# Patient Record
Sex: Female | Born: 1995
Health system: Southern US, Community
[De-identification: ages and names within clinical notes are randomized; demographics above are authoritative.]

## PROBLEM LIST (undated history)

## (undated) DIAGNOSIS — F419 Anxiety disorder, unspecified: Secondary | ICD-10-CM

## (undated) DIAGNOSIS — Z789 Other specified health status: Secondary | ICD-10-CM

## (undated) DIAGNOSIS — F32A Depression, unspecified: Secondary | ICD-10-CM

## (undated) HISTORY — DX: Depression, unspecified: F32.A

## (undated) HISTORY — DX: Other specified health status: Z78.9

## (undated) HISTORY — DX: Anxiety disorder, unspecified: F41.9

---

## 2016-06-07 DIAGNOSIS — Z3483 Encounter for supervision of other normal pregnancy, third trimester: Secondary | ICD-10-CM | POA: Diagnosis not present

## 2016-06-27 DIAGNOSIS — Z3483 Encounter for supervision of other normal pregnancy, third trimester: Secondary | ICD-10-CM | POA: Diagnosis not present

## 2016-06-27 DIAGNOSIS — Z3A38 38 weeks gestation of pregnancy: Secondary | ICD-10-CM | POA: Diagnosis not present

## 2016-06-28 DIAGNOSIS — Z3A38 38 weeks gestation of pregnancy: Secondary | ICD-10-CM | POA: Diagnosis not present

## 2016-08-30 DIAGNOSIS — Z3040 Encounter for surveillance of contraceptives, unspecified: Secondary | ICD-10-CM | POA: Diagnosis not present

## 2016-08-30 DIAGNOSIS — Z3202 Encounter for pregnancy test, result negative: Secondary | ICD-10-CM | POA: Diagnosis not present

## 2017-03-02 DIAGNOSIS — Z6832 Body mass index (BMI) 32.0-32.9, adult: Secondary | ICD-10-CM | POA: Diagnosis not present

## 2017-03-02 DIAGNOSIS — Z01419 Encounter for gynecological examination (general) (routine) without abnormal findings: Secondary | ICD-10-CM | POA: Diagnosis not present

## 2017-03-02 DIAGNOSIS — Z3202 Encounter for pregnancy test, result negative: Secondary | ICD-10-CM | POA: Diagnosis not present

## 2017-05-08 DIAGNOSIS — D225 Melanocytic nevi of trunk: Secondary | ICD-10-CM | POA: Diagnosis not present

## 2017-08-17 DIAGNOSIS — Z3201 Encounter for pregnancy test, result positive: Secondary | ICD-10-CM | POA: Diagnosis not present

## 2017-09-03 DIAGNOSIS — Z3481 Encounter for supervision of other normal pregnancy, first trimester: Secondary | ICD-10-CM | POA: Diagnosis not present

## 2017-09-03 DIAGNOSIS — Z3A00 Weeks of gestation of pregnancy not specified: Secondary | ICD-10-CM | POA: Diagnosis not present

## 2017-09-03 DIAGNOSIS — O3680X0 Pregnancy with inconclusive fetal viability, not applicable or unspecified: Secondary | ICD-10-CM | POA: Diagnosis not present

## 2017-09-06 DIAGNOSIS — Z3481 Encounter for supervision of other normal pregnancy, first trimester: Secondary | ICD-10-CM | POA: Diagnosis not present

## 2017-09-28 DIAGNOSIS — O3680X0 Pregnancy with inconclusive fetal viability, not applicable or unspecified: Secondary | ICD-10-CM | POA: Diagnosis not present

## 2017-09-28 DIAGNOSIS — Z3A10 10 weeks gestation of pregnancy: Secondary | ICD-10-CM | POA: Diagnosis not present

## 2017-10-10 DIAGNOSIS — Z3A12 12 weeks gestation of pregnancy: Secondary | ICD-10-CM | POA: Diagnosis not present

## 2017-10-10 DIAGNOSIS — Z23 Encounter for immunization: Secondary | ICD-10-CM | POA: Diagnosis not present

## 2017-10-10 DIAGNOSIS — Z3481 Encounter for supervision of other normal pregnancy, first trimester: Secondary | ICD-10-CM | POA: Diagnosis not present

## 2017-10-10 DIAGNOSIS — Z3682 Encounter for antenatal screening for nuchal translucency: Secondary | ICD-10-CM | POA: Diagnosis not present

## 2017-11-27 DIAGNOSIS — Z363 Encounter for antenatal screening for malformations: Secondary | ICD-10-CM | POA: Diagnosis not present

## 2017-11-27 DIAGNOSIS — Z3A19 19 weeks gestation of pregnancy: Secondary | ICD-10-CM | POA: Diagnosis not present

## 2017-12-31 DIAGNOSIS — R109 Unspecified abdominal pain: Secondary | ICD-10-CM | POA: Diagnosis not present

## 2018-01-23 DIAGNOSIS — Z3482 Encounter for supervision of other normal pregnancy, second trimester: Secondary | ICD-10-CM | POA: Diagnosis not present

## 2018-01-23 DIAGNOSIS — O4402 Placenta previa specified as without hemorrhage, second trimester: Secondary | ICD-10-CM | POA: Diagnosis not present

## 2018-01-29 DIAGNOSIS — R319 Hematuria, unspecified: Secondary | ICD-10-CM | POA: Diagnosis not present

## 2018-01-29 DIAGNOSIS — N390 Urinary tract infection, site not specified: Secondary | ICD-10-CM | POA: Diagnosis not present

## 2018-01-29 DIAGNOSIS — O2313 Infections of bladder in pregnancy, third trimester: Secondary | ICD-10-CM | POA: Diagnosis not present

## 2018-01-29 DIAGNOSIS — Z3A28 28 weeks gestation of pregnancy: Secondary | ICD-10-CM | POA: Diagnosis not present

## 2018-01-29 DIAGNOSIS — O2303 Infections of kidney in pregnancy, third trimester: Secondary | ICD-10-CM | POA: Diagnosis not present

## 2018-01-30 DIAGNOSIS — N3289 Other specified disorders of bladder: Secondary | ICD-10-CM | POA: Diagnosis not present

## 2018-01-30 DIAGNOSIS — N390 Urinary tract infection, site not specified: Secondary | ICD-10-CM | POA: Diagnosis not present

## 2018-01-30 DIAGNOSIS — O2303 Infections of kidney in pregnancy, third trimester: Secondary | ICD-10-CM | POA: Diagnosis not present

## 2018-01-30 DIAGNOSIS — Z3A28 28 weeks gestation of pregnancy: Secondary | ICD-10-CM | POA: Diagnosis not present

## 2018-02-06 DIAGNOSIS — Z23 Encounter for immunization: Secondary | ICD-10-CM | POA: Diagnosis not present

## 2018-02-06 DIAGNOSIS — Z3483 Encounter for supervision of other normal pregnancy, third trimester: Secondary | ICD-10-CM | POA: Diagnosis not present

## 2018-03-19 DIAGNOSIS — Z3483 Encounter for supervision of other normal pregnancy, third trimester: Secondary | ICD-10-CM | POA: Diagnosis not present

## 2018-04-11 DIAGNOSIS — Z3A38 38 weeks gestation of pregnancy: Secondary | ICD-10-CM | POA: Diagnosis not present

## 2018-04-12 DIAGNOSIS — Z79899 Other long term (current) drug therapy: Secondary | ICD-10-CM | POA: Diagnosis not present

## 2018-04-12 DIAGNOSIS — Z3A38 38 weeks gestation of pregnancy: Secondary | ICD-10-CM | POA: Diagnosis not present

## 2018-04-12 DIAGNOSIS — O99613 Diseases of the digestive system complicating pregnancy, third trimester: Secondary | ICD-10-CM | POA: Diagnosis not present

## 2018-04-12 DIAGNOSIS — O9962 Diseases of the digestive system complicating childbirth: Secondary | ICD-10-CM | POA: Diagnosis not present

## 2018-04-12 DIAGNOSIS — K219 Gastro-esophageal reflux disease without esophagitis: Secondary | ICD-10-CM | POA: Diagnosis not present

## 2018-04-13 DIAGNOSIS — Z3A38 38 weeks gestation of pregnancy: Secondary | ICD-10-CM | POA: Diagnosis not present

## 2018-05-02 DIAGNOSIS — E669 Obesity, unspecified: Secondary | ICD-10-CM | POA: Diagnosis not present

## 2018-05-02 DIAGNOSIS — F4325 Adjustment disorder with mixed disturbance of emotions and conduct: Secondary | ICD-10-CM | POA: Diagnosis not present

## 2018-05-16 ENCOUNTER — Ambulatory Visit (INDEPENDENT_AMBULATORY_CARE_PROVIDER_SITE_OTHER): Payer: BLUE CROSS/BLUE SHIELD | Admitting: Licensed Clinical Social Worker

## 2018-05-16 ENCOUNTER — Ambulatory Visit (INDEPENDENT_AMBULATORY_CARE_PROVIDER_SITE_OTHER): Payer: BLUE CROSS/BLUE SHIELD | Admitting: Pediatrics

## 2018-05-16 ENCOUNTER — Encounter: Payer: Self-pay | Admitting: Pediatrics

## 2018-05-16 VITALS — BP 105/60 | HR 85 | Ht 65.67 in | Wt 207.5 lb

## 2018-05-16 DIAGNOSIS — Z113 Encounter for screening for infections with a predominantly sexual mode of transmission: Secondary | ICD-10-CM | POA: Diagnosis not present

## 2018-05-16 DIAGNOSIS — F4323 Adjustment disorder with mixed anxiety and depressed mood: Secondary | ICD-10-CM | POA: Insufficient documentation

## 2018-05-16 DIAGNOSIS — Z638 Other specified problems related to primary support group: Secondary | ICD-10-CM | POA: Diagnosis not present

## 2018-05-16 MED ORDER — SERTRALINE HCL 25 MG PO TABS: 25.0000 mg | ORAL_TABLET | Freq: Every day | ORAL | 1 refills | Status: DC

## 2018-05-16 NOTE — BH Specialist Note (Signed)
Integrated Behavioral Health Initial Visit  MRN: [id number] Name: Kelly Powell  Number of Integrated Behavioral Health Clinician visits:: 1/6 Session Start time: 9:50 AM Session End time: 10:22 AM  Total time: 32 minutes  Type of Service: Integrated Behavioral Health- Individual/Family Interpretor:No. Interpretor Name and Language: N/A   Warm Hand Off Completed.      SUBJECTIVE: Kelly Powell is a 22 y.o. female accompanied by Patient Patient was referred by Kelly Ramus, NP for New Patient, anxiety, needs assessment. Patient reports the following symptoms/concerns: Overall anxious and overwhelmed, feels like snapping most of the time, feels unsupported. Discord with husband around communication and distribution of tasks. Duration of problem: Ongoing, more acute in the past few months- first started as a teen; Severity of problem: severe  OBJECTIVE: Mood: Euthymic and Affect: Appropriate Risk of harm to self or others: No plan to harm self or others  Social History:  Lifestyle habits that can impact QOL: Sleep:Terrible, 4 months of horrible sleep (third trimester and now breast feeding) - Try to go to bed around 9AM, Household gets up between 5A-7A Eating habits/patterns: 3 meals, sometimes breakfast is just a honey bun. Enjoys going out to eat. Water intake: About a gallon of water a day- thirsty all the time, but is breastfeeding. Screen time: Not a lot of time, too busy- TV on at night Exercise: Going to start at the gym soon, currently just normal daily movement around the house.  Goal: turning off TV at night   Confidentiality was discussed with the patient and if applicable, with caregiver as well. History or current traumatic events (natural disaster, house fire, etc.)? no History or current physical trauma?  no History or current emotional trauma?  yes, parents discord growing up History or current sexual trauma?  no History or current domestic or intimate  partner violence?  no History of bullying:  no  Suicidal or homicidal thoughts?   no Self injurious behaviors?  no Guns in the home?  yes, safe, locked up   GOALS ADDRESSED: Patient will: 1. Reduce symptoms of: anxiety and stress 2. Increase knowledge and/or ability of: coping skills, healthy habits, self-management skills and stress reduction  3. Demonstrate ability to: Increase healthy adjustment to current life circumstances and Increase adequate support systems for patient/family  INTERVENTIONS: Interventions utilized: Motivational Interviewing, Mindfulness or Management consultant, Mining engineer, Supportive Counseling and Psychoeducation and/or Health Education  Standardized Assessments completed: Not Needed  ASSESSMENT: Patient currently experiencing multiple stressors (3 kids 3,2,newborn), history of anger (likely stress and anxiety), feeling overwhelmed and ready to snap. Seeking medication management to "take the edge off and help me cope better."   Patient may benefit from brief interventions, psychotherapy. Patient is open to working on healthy communication and other skills.  PLAN: 1. Follow up with behavioral health clinician on : 05/30/2018 2. Behavioral recommendations: Patient to turn off TV at night, find task for husband to do with kids that she doesn't have to police and can praise for. Start Zoloft , return to see [facility] in 2 weeks. 3. Referral(s): Integrated [facility] [facility] (In Clinic) 4. "From scale of 1-10, how likely are you to follow plan?": 10  Kelly Powell, [place]

## 2018-05-16 NOTE — Progress Notes (Signed)
THIS RECORD MAY CONTAIN CONFIDENTIAL INFORMATION THAT SHOULD NOT BE RELEASED WITHOUT REVIEW OF THE SERVICE PROVIDER.  Adolescent Medicine Consultation Initial Visit Kelly Powell  is a 22 y.o. female referred by No ref. provider found here today for evaluation of anxiety, parenting stress.      Review of records?  yes  Pertinent Labs? No  Growth Chart Viewed? yes   History was provided by the patient.  PCP Confirmed?  Does not have adult- managed by GYN  Chief Complaint  Patient presents with  . New Patient (Initial Visit)    HPI:    Willing to come back and work with [facility]. She is willing to learn some coping skills and learn how to relate to her husband to help strengthen their relationship and get more help with the kids.  Anxious, stressed and overwhelmed with 3 young children  Angry with husband about some of his behaviors  Reports she was "crazy" as a teen- punch walls, get angry. Goal is not to scream at kids anymore. She threw water on her 5 year old the other day in a restaurant and then she really felt like she needed to get help. She has not had any thoughts of actively harming her children or herself.   Sleep has been bad through third trimester, up in the night now breastfeeding.  Her infant may have a milk protein allergy so she may end up stopping breastfeeding sooner depending on what happens in the next month.   Her husband is loving and supportive, however, is also fairly needy and has trouble figuring out how to help her so often stays outside tinkering with the pool ,cars, etc.    No LMP recorded.  Review of Systems:    Not on File Outpatient Medications Prior to Visit  Medication Sig Dispense Refill  . ibuprofen (ADVIL,MOTRIN) 800 MG tablet Take by mouth.    . butalbital-acetaminophen-caffeine (FIORICET, ESGIC) 50-325-40 MG tablet     . norethindrone (MICRONOR,CAMILA,ERRIN) 0.35 MG tablet      No facility-administered medications prior to visit.       Patient Active Problem List   Diagnosis Date Noted  . Parenting stress 05/17/2018  . Adjustment disorder with mixed anxiety and depressed mood 05/16/2018    Past Medical History:  Reviewed and updated?  yes Past Medical History:  Diagnosis Date  . Medical history non-contributory     Family History: Reviewed and updated? yes Family History  Problem Relation Age of Onset  . Anxiety disorder Mother   . Anxiety disorder Father   . Anxiety disorder Sister   . ADD / ADHD Brother   . Anxiety disorder Brother     Lifestyle habits that can impact QOL: Sleep:Terrible, 4 months of horrible sleep (third trimester and now breast feeding) - Try to go to bed around 9AM, Household gets up between 5A-7A Eating habits/patterns:3 meals, sometimes breakfast is just a honey bun. Enjoys going out to eat. Water intake:About a gallon of water a day- thirsty all the time, but is breastfeeding. Screen time:Not a lot of time, too busy- TV on at night Exercise:Going to start at the gym soon, currently just normal daily movement around the house.  Goal: turning off TV at night   Confidentiality was discussed with the patient and if applicable, with caregiver as well. History or current traumatic events (natural disaster, house fire, etc.)?no History or current physical trauma?no History or current emotional trauma?yes,parents discord growing up History or current sexual trauma?no History or  current domestic or intimate partner violence?no History of bullying:no  Suicidal or homicidal thoughts?no Self injurious behaviors?no Guns in the home?yes,safe, locked up    The following portions of the patient's history were reviewed and updated as appropriate: allergies, current medications, past family history, past medical history, past social history, past surgical history and problem list.  Physical Exam:  Vitals:   05/16/18 0937  BP: 105/60  Pulse: 85  Weight: 207  lb 8 oz (94.1 kg)  Height: 5' 5.67" (1.668 m)   BP 105/60 (BP Location: Left Arm, Patient Position: Sitting, Cuff Size: Normal)   Pulse 85   Ht 5' 5.67" (1.668 m)   Wt 207 lb 8 oz (94.1 kg)   BMI 33.83 kg/m  Body mass index: body mass index is 33.83 kg/m. Growth percentile SmartLinks can only be used for patients less than 59 years old.   Physical Exam  Constitutional: She appears well-developed. No distress.  HENT:  Mouth/Throat: Oropharynx is clear and moist.  Neck: No thyromegaly present.  Cardiovascular: Normal rate and regular rhythm.  No murmur heard. Pulmonary/Chest: Breath sounds normal.  Abdominal: Soft. She exhibits no mass. There is no tenderness. There is no guarding.  Musculoskeletal: She exhibits no edema.  Lymphadenopathy:    She has no cervical adenopathy.  Neurological: She is alert.  Skin: Skin is warm. No rash noted.  Psychiatric: She has a normal mood and affect.  Nursing note and vitals reviewed.    Assessment/Plan: Assessment/Plan: 1. Adjustment disorder with mixed anxiety and depressed mood Will start sertraline 25 mg daily. As studied, sertraline is the safest in breastfeeding, but most SSRIs known to be safe, just found in higher concentrations in breastmilk. zoloft if almost undetectable. We discussed this. She has no active SI or thoughts of hurting her children. She will f/u in 2 weeks with [facility] and we will likely increase her dose to 50 mg daily. Will do PHQSADs and edinburgh at next visit.  - sertraline (ZOLOFT) 25 MG tablet; Take 1 tablet (25 mg total) by mouth daily.  Dispense: 30 tablet; Refill: 1  2. Parenting stress Discussed ways to partner with husband in parenting and get more help and have more time together. She will have him give older girls a bath and get them to bed so she can have some down time from them.   3. Routine screening for STI (sexually transmitted infection) Per protocol.  - C. trachomatis/N. gonorrhoeae  RNA   Follow-up:   2 weeks with [facility], 4-6 weeks with provider   Medical decision-making:  >30 minutes spent face to face with patient with more than 50% of appointment spent discussing diagnosis, management, follow-up, and reviewing of anxiety, anger, parenting stress.  CC: No primary care provider on file., No ref. provider found

## 2018-05-16 NOTE — Patient Instructions (Signed)

## 2018-05-17 ENCOUNTER — Encounter: Payer: Self-pay | Admitting: Pediatrics

## 2018-05-17 DIAGNOSIS — Z638 Other specified problems related to primary support group: Secondary | ICD-10-CM | POA: Insufficient documentation

## 2018-05-17 LAB — C. TRACHOMATIS/N. GONORRHOEAE RNA
C. trachomatis RNA, TMA: NOT DETECTED
N. GONORRHOEAE RNA, TMA: NOT DETECTED

## 2018-05-30 ENCOUNTER — Other Ambulatory Visit: Payer: Self-pay | Admitting: Pediatrics

## 2018-05-30 ENCOUNTER — Ambulatory Visit (INDEPENDENT_AMBULATORY_CARE_PROVIDER_SITE_OTHER): Payer: BLUE CROSS/BLUE SHIELD | Admitting: Licensed Clinical Social Worker

## 2018-05-30 DIAGNOSIS — F4323 Adjustment disorder with mixed anxiety and depressed mood: Secondary | ICD-10-CM

## 2018-05-30 MED ORDER — SERTRALINE HCL 50 MG PO TABS: 50.0000 mg | ORAL_TABLET | Freq: Every day | ORAL | 2 refills | Status: DC

## 2018-05-30 NOTE — BH Specialist Note (Signed)
Integrated Behavioral Health Follow Up Visit  MRN: [id number] Name: Kelly Powell Heine  Number of Integrated Behavioral Health Clinician visits: 2/6 Session Start time: 9:00A  Session End time: 10:00AM Total time: 1 hour  Type of Service: Integrated Behavioral Health- Individual/Family Interpretor:No. Interpretor Name and Language: N/A  SUBJECTIVE: Kelly Powell Sawchuk is a 22 y.o. female accompanied by Patient Patient was referred by Alfonso Ramusaroline Hacker, NP for medication monitoring, anxiety, relationship challenges with spouse.. Patient reports the following symptoms/concerns: Communication challenges with husband, some improvement in mood Duration of problem: Years, but less acute in the past 2 weeks; Severity of problem: moderate  OBJECTIVE: Mood: Euthymic and Affect: Appropriate Risk of harm to self or others: No plan to harm self or others  LIFE CONTEXT: Family and Social: At home with spouse, 3 kids, dog School/Work: Works night shift Thurs, Fri, Sat -12hour shifts Self-Care: Minimal, likes to go out to eat Life Changes: Birth of 3rd baby  GOALS ADDRESSED: Patient will: 1.  Reduce symptoms of: stress  2.  Increase knowledge and/or ability of: coping skills, healthy habits and stress reduction  3.  Demonstrate ability to: Increase healthy adjustment to current life circumstances  INTERVENTIONS: Interventions utilized:  Motivational Interviewing, Solution-Focused Strategies, Behavioral Activation, Medication Monitoring and Psychoeducation and/or Health Education Standardized Assessments completed: Edinburgh Postnatal Depression  -Score = 3  Zoloft- 25mg  2:30PM, every day No missed doses  The Antidepressant Side Effect Checklist (ASEC)  Symptom Score (0-3) Linked to Medication? Comments  Dry Mouth 0    Drowsiness 0    Insomnia 0    Blurred Vision 0    Headache 0    Constipation 0    Diarrhea  0    Increased Appetite 0    Decreased Appetite 0    Nausea/Vomiting 0    Problems  Urinating 0    Problems with Sex 0    Palpitations 0    Lightheaded on Standing 0    Room Spinning 0    Sweating 0    Feeling Hot 0    Tremor 0    Disoriented 0    Yawning 0    Weight Gain 0    Other Symptoms? No  Treatment for Side Effects?   Side Effects make you want to stop taking??     ASSESSMENT: Patient currently experiencing improvement in mood, feeling less impacted by stressors. Says she tried everything discussed, husband did not help with bathtime. Concerns around kids' sleeping schedule.  Discussed: 5 Love Languages (patient and husband to take quiz and pick one thing they will do for each other) Healthy communication (I feel, prefacing challenging topics, "How can I help?".) Sleep/reward charts for kids.   Patient may benefit from consistency and partnership with spouse as a team.  PLAN: 1. Follow up with behavioral health clinician on : 06/20/18 2. Behavioral recommendations: See above 3. Referral(s): [facility] 4. "From scale of 1-10, how likely are you to follow plan?": 10  Gaetana MichaelisShannon W Kincaid, [place]

## 2018-06-20 ENCOUNTER — Ambulatory Visit (INDEPENDENT_AMBULATORY_CARE_PROVIDER_SITE_OTHER): Payer: BLUE CROSS/BLUE SHIELD | Admitting: Licensed Clinical Social Worker

## 2018-06-20 ENCOUNTER — Ambulatory Visit: Payer: BLUE CROSS/BLUE SHIELD | Admitting: Pediatrics

## 2018-06-20 ENCOUNTER — Encounter: Payer: Self-pay | Admitting: Pediatrics

## 2018-06-20 VITALS — BP 108/67 | HR 65 | Ht 65.0 in | Wt 198.2 lb

## 2018-06-20 DIAGNOSIS — F4323 Adjustment disorder with mixed anxiety and depressed mood: Secondary | ICD-10-CM

## 2018-06-20 DIAGNOSIS — Z638 Other specified problems related to primary support group: Secondary | ICD-10-CM | POA: Diagnosis not present

## 2018-06-20 NOTE — Progress Notes (Signed)
THIS RECORD MAY CONTAIN CONFIDENTIAL INFORMATION THAT SHOULD NOT BE RELEASED WITHOUT REVIEW OF THE SERVICE PROVIDER.  Adolescent Medicine Consultation Follow-Up Visit Kelly Powell  is a 22 y.o. female here today for follow-up regarding anxiety, parenting stress.    Last seen in Adolescent Medicine Clinic on 05/16/18 for anxiety.  Plan at last visit included starting sertraline 25 mg daily then increased to 50 mg at visit on 5/23.     History was provided by the patient.   PCP Confirmed?  Does not have adult- managed by GYN  My Chart Activated?   yes    Chief Complaint  Patient presents with  . Follow-up  . Medication Management    HPI:   Kelly Powell reports that she has been doing a lot better. She has noticed a significant difference since increasing to 49 with improved mood and less irritability. Not yelling at kids anymore. Not worried about small things like house getting cleaned. Did love languages quiz with husband and worked on healthy communication that has improved their relationship. Did rewards charts for children for sleeping that has worked well- 21 days in a row of good sleep. Leg achiness at night 3 x this past month, was wondering if its related to medication.      Has had continuous bleeding on progestrone OCP.  No Known Allergies Outpatient Medications Prior to Visit  Medication Sig Dispense Refill  . butalbital-acetaminophen-caffeine (FIORICET, ESGIC) 50-325-40 MG tablet     . ibuprofen (ADVIL,MOTRIN) 800 MG tablet Take by mouth.    . norethindrone (MICRONOR,CAMILA,ERRIN) 0.35 MG tablet     . sertraline (ZOLOFT) 50 MG tablet Take 1 tablet (50 mg total) by mouth daily. 30 tablet 2   No facility-administered medications prior to visit.      Patient Active Problem List   Diagnosis Date Noted  . Parenting stress 05/17/2018  . Adjustment disorder with mixed anxiety and depressed mood 05/16/2018    Social History: Lives with: husband, 3 children Exercise:  got  free membership at gym, interested in starting Sleep:  Sleeping through the night now that 54 month old is sleeping through the night  Eating: skipping meals, eating one big meal a day to try to lose weight  Confidentiality was discussed with the patient and if applicable, with caregiver as well.  Tobacco?  no Drugs/ETOH?  no Partner preference?  female Sexually Active?  yes  Pregnancy Prevention:  progesterone only birth control, husband getting vasectomy next week Trauma currently or in the pastt?  no Suicidal or Self-Harm thoughts?   no Guns in the home?  Yes, safe, locked up    The following portions of the patient's history were reviewed and updated as appropriate: allergies, current medications, past family history, past medical history, past social history, past surgical history and problem list.  Physical Exam:  Vitals:   06/20/18 0911  BP: 108/67  Pulse: 65  Weight: 198 lb 3.2 oz (89.9 kg)  Height: 5\' 5"  (1.651 m)   BP 108/67   Pulse 65   Ht 5\' 5"  (1.651 m)   Wt 198 lb 3.2 oz (89.9 kg)   BMI 32.98 kg/m  Body mass index: body mass index is 32.98 kg/m. Growth percentile SmartLinks can only be used for patients less than 3 years old.   Physical Exam  Gen: well appearing female, well nourished HEENT: EOMI, nares patent Pulm: lungs clear, comfortable WOB CV: RRR, no murmurs Abd: soft, NT Neuro: alert, appropriate  Psych: smiling, normal mood affect  PHQ-SADs with improved symptoms of anxiety, depression   Assessment/Plan: 1. Adjustment disorder with mixed anxiety and depressed mood PHQ-SADs with improved symptoms of anxiety/depression, reported improvement in irritability. She feels like she may have plateaued - return for visit with behavioral health on 7/11, possibly increase dose to 75 mg at that time  2. Parenting stress - discussed continued ways to partner with husband in parenting - strategies discussed with [facility] have been working well  Follow-up:  F/u  with behavioral health in 2 weeks then with provider in 4 weeks  Medical decision-making:  >25 minutes spent face to face with patient with more than 50% of appointment spent discussing diagnosis, management, follow-up, and reviewing of anxiety, depression, parenting stress.  Lelan Ponsaroline Newman, MD

## 2018-06-20 NOTE — BH Specialist Note (Signed)
Integrated Behavioral Health Follow Up Visit  MRN: [id number] Name: Kelly StandardHannah Bryden   Any copied material below has been reviewed for accuracy.  Number of Integrated Behavioral Health Clinician visits: 3/6 Session Start time: 9:42 AM   Session End time: 10:12 AM  Total time: 30 minutes  Type of Service: Integrated Behavioral Health- Individual/Family Interpretor:No. Interpretor Name and Language: N/A  SUBJECTIVE: Kelly Powell is a 22 y.o. female accompanied by Patient Patient was referred by Alfonso Ramusaroline Hacker, NP for medication monitoring, anxiety, relationship challenges with spouse.. Patient reports the following symptoms/concerns: Communication challenges with husband, some improvement in mood Duration of problem: years; Severity of problem: moderate  OBJECTIVE: Mood: Euthymic and Affect: Appropriate Risk of harm to self or others: No plan to harm self or others  LIFE CONTEXT: Family and Social: At home with spouse, 3 kids, dog School/Work: Works night shift Thurs, Fri, Sat -12hour shifts Self-Care: Minimal, likes to go out to eat Life Changes: Birth of 3rd baby  GOALS ADDRESSED: Patient will: 1.  Reduce symptoms of: stress  2.  Increase knowledge and/or ability of: coping skills, healthy habits and stress reduction  3.  Demonstrate ability to: Increase healthy adjustment to current life circumstances   INTERVENTIONS: Interventions utilized:  Motivational Interviewing, Solution-Focused Strategies, Behavioral Activation, Supportive Counseling and Psychoeducation and/or Health Education Standardized Assessments completed: Not Needed  ASSESSMENT: Patient currently experiencing continued improvement in mood, going to bring husband to next session. Below find list of goals that have been in process, but patient wanted for her refrigerator.    Healthy Communication . "How can I help?" . Assume the best intention. When someone says something, try to assume they are being  positive and kind. Ex: What are you doing? Think: They are worried about me because they love me. . Using "I language." Ex: I feel sad when D.R. Horton, IncBrooke screams all day. Instead of: YOU NEVER HELP ME WITH BROOKE AND SHE HAS BEEN SCREAMING ALL DAY!  Love Languages . Trying to think about how we can serve each other's needs based on Love Languages?  Kids . Planned ignoring - ignore bad behavior as long as children are safe. . Offer choices that please the parents. Ex: You can put your shoes on or you can go brush your teeth. .Both option accomplish things that you want, but the child feels they have more control. Reward charts/ sticker charts for behaviors.Marland Kitchen.  PLAN: 1. Follow up with behavioral health clinician on : 7/11 2. Behavioral recommendations: Patient to continue to work with husband on their goals, husband to come to next visit. 3. Referral(s): [facility] 4. "From scale of 1-10, how likely are you to follow plan?": 10  Gaetana MichaelisShannon W Kincaid, [place]

## 2018-06-20 NOTE — Patient Instructions (Signed)
Return to see Ruben GottronShannon Kincaid on July 11th.  Eat 6 small meals a day to get the best nutrition to feed your baby

## 2018-07-04 ENCOUNTER — Ambulatory Visit: Payer: Self-pay | Admitting: Licensed Clinical Social Worker

## 2018-07-04 ENCOUNTER — Other Ambulatory Visit: Payer: Self-pay | Admitting: Family

## 2018-07-04 ENCOUNTER — Telehealth (INDEPENDENT_AMBULATORY_CARE_PROVIDER_SITE_OTHER): Payer: Self-pay

## 2018-07-04 MED ORDER — SERTRALINE HCL 50 MG PO TABS: 50.0000 mg | ORAL_TABLET | Freq: Every day | ORAL | 2 refills | Status: DC

## 2018-07-04 NOTE — Telephone Encounter (Signed)
Rx sent 

## 2018-07-04 NOTE — Telephone Encounter (Signed)
Received RX refill request from [facility] for patient for Kelly RamusCaroline Hacker to refill Sertraline HCL 25mg . Provider no longer in office, will route to her provider, and her nurse so refill may be handled by correct office.

## 2018-07-11 ENCOUNTER — Ambulatory Visit: Payer: BLUE CROSS/BLUE SHIELD | Admitting: Licensed Clinical Social Worker

## 2018-08-19 DIAGNOSIS — O9122 Nonpurulent mastitis associated with the puerperium: Secondary | ICD-10-CM | POA: Diagnosis not present

## 2018-09-18 ENCOUNTER — Telehealth: Payer: Self-pay | Admitting: *Deleted

## 2018-09-18 ENCOUNTER — Other Ambulatory Visit: Payer: Self-pay | Admitting: Pediatrics

## 2018-09-18 MED ORDER — SERTRALINE HCL 50 MG PO TABS: 50.0000 mg | ORAL_TABLET | Freq: Every day | ORAL | 2 refills | Status: DC

## 2018-09-18 NOTE — Telephone Encounter (Signed)
Pt made f/u appointment on 10/3 but out of Zoloft and needs bridge refill. Routing to [facility].

## 2018-09-18 NOTE — Telephone Encounter (Signed)
Done

## 2018-09-26 ENCOUNTER — Encounter: Payer: Self-pay | Admitting: Pediatrics

## 2018-09-26 ENCOUNTER — Ambulatory Visit: Payer: BLUE CROSS/BLUE SHIELD | Admitting: Pediatrics

## 2018-09-26 ENCOUNTER — Other Ambulatory Visit: Payer: Self-pay

## 2018-09-26 VITALS — BP 97/65 | HR 83 | Ht 64.76 in | Wt 184.2 lb

## 2018-09-26 DIAGNOSIS — F4323 Adjustment disorder with mixed anxiety and depressed mood: Secondary | ICD-10-CM | POA: Diagnosis not present

## 2018-09-26 DIAGNOSIS — Z638 Other specified problems related to primary support group: Secondary | ICD-10-CM

## 2018-09-26 MED ORDER — SERTRALINE HCL 100 MG PO TABS: 100.0000 mg | ORAL_TABLET | Freq: Every day | ORAL | 2 refills | Status: DC

## 2018-09-26 NOTE — Progress Notes (Signed)
THIS RECORD MAY CONTAIN CONFIDENTIAL INFORMATION THAT SHOULD NOT BE RELEASED WITHOUT REVIEW OF THE SERVICE PROVIDER.  Adolescent Medicine Consultation Follow-Up Visit Aziza Stuckert  is a 22 y.o. female referred by No ref. provider found here today for follow-up regarding anxiety, parenting stress.    Last seen in [facility]olescent Medicine Clinic on 06/20/18 for anxiety, parenting stress.  Plan at last visit included increased Zoloft dose to 50mg , continue counseling with BH.  Pertinent Labs? No Growth Chart Viewed? not applicable   History was provided by the patient.  Interpreter? no PCP Confirmed?  No PCP My Chart Activated?   no   Chief Complaint  Patient presents with  . Follow-up    HPI:  Patient states she is noticing she gets frustrated more easily and quickly than before although still improved from initial visit. Has been working with [facility] with good effect and identifiable triggers. Recent new stressors include recently selling their house, living in a camper, and recently accepted a new job. Current coping mechanisms include talking to her mom and sister. Denies chest pain, SOB, nausea, vomiting. Recently started dieting to eat healthier.   No LMP recorded. Allergies  Allergen Reactions  . Bee Venom Hives   Outpatient Medications Prior to Visit  Medication Sig Dispense Refill  . ibuprofen (ADVIL,MOTRIN) 800 MG tablet Take by mouth.    . sertraline (ZOLOFT) 50 MG tablet Take 1 tablet (50 mg total) by mouth daily. 30 tablet 2  . butalbital-acetaminophen-caffeine (FIORICET, ESGIC) 50-325-40 MG tablet     . norethindrone (MICRONOR,CAMILA,ERRIN) 0.35 MG tablet      No facility-administered medications prior to visit.      Patient Active Problem List   Diagnosis Date Noted  . Parenting stress 05/17/2018  . Adjustment disorder with mixed anxiety and depressed mood 05/16/2018   Lifestyle habits that can impact QOL: Sleep:difficulty sleeping due to sleep cycle of young children,  works 3rd shift Eating habits/patterns: recently started dieting, low carb Exercise: no formal exercise  Suicidal or homicidal thoughts?   no Self injurious behaviors?  no  The following portions of the patient's history were reviewed and updated as appropriate: allergies, current medications, past medical history, past social history and problem list.  Physical Exam:  Vitals:   09/26/18 1341  BP: 97/65  Pulse: 83  Weight: 184 lb 3.2 oz (83.6 kg)  Height: 5' 4.76" (1.645 m)   BP 97/65   Pulse 83   Ht 5' 4.76" (1.645 m)   Wt 184 lb 3.2 oz (83.6 kg)   BMI 30.88 kg/m  Body mass index: body mass index is 30.88 kg/m. Growth percentile SmartLinks can only be used for patients less than 72 years old.  Physical Exam  Constitutional: She is oriented to person, place, and time. She appears well-developed and well-nourished. No distress.  Cardiovascular: Normal rate, regular rhythm and normal heart sounds.  No murmur heard. Pulmonary/Chest: Effort normal and breath sounds normal.  Abdominal: Soft. Bowel sounds are normal.  Neurological: She is alert and oriented to person, place, and time.  Skin: Skin is warm and dry.  Psychiatric: She has a normal mood and affect. Her behavior is normal. Judgment normal.   Assessment/Plan: Nazli is a healthy 22yo F here for anxiety and parenting stress.  Anxiety Symptoms better controlled than initially although with some increase in irritability and anxiousness. Will increase Zoloft to 100mg  daily and continue therapy with BH. Follow up in one month.  BH screenings: PHQ-SADs reviewed and indicated increased irritability and  anxiousness. Screens discussed with patient and parent and adjustments to plan made accordingly.   Follow-up:  No follow-ups on file.   Medical decision-making:  >15 minutes spent face to face with patient with more than 50% of appointment spent discussing diagnosis, management, follow-up, and reviewing of  anxiety.

## 2018-09-26 NOTE — Patient Instructions (Signed)
It was great to see you!  Our plans for today:  - We are increasing your Zoloft to 100mg  daily.  - Follow up in one month.  Take care and seek immediate care sooner if you develop any concerns.   Dr. [facility]

## 2018-10-21 ENCOUNTER — Telehealth: Payer: Self-pay | Admitting: *Deleted

## 2018-10-21 ENCOUNTER — Other Ambulatory Visit: Payer: Self-pay | Admitting: Pediatrics

## 2018-10-21 MED ORDER — NITROFURANTOIN MONOHYD MACRO 100 MG PO CAPS: 100.0000 mg | ORAL_CAPSULE | Freq: Two times a day (BID) | ORAL | 0 refills | Status: AC

## 2018-10-21 NOTE — Telephone Encounter (Signed)
Done

## 2018-10-21 NOTE — Progress Notes (Signed)
ma

## 2018-10-21 NOTE — Telephone Encounter (Signed)
Pt called reporting dysuria that started this morning. She is afebrile. No back pain or abdominal pain. No changes in discharge. Pt asking if antibiotic can be sent in. Advised if still symptomatic after medication- she should have an office visit to follow up. Pt voiced understanding.

## 2018-10-28 ENCOUNTER — Ambulatory Visit: Payer: Self-pay | Admitting: Pediatrics

## 2018-10-28 ENCOUNTER — Ambulatory Visit: Payer: Self-pay | Admitting: Licensed Clinical Social Worker

## 2018-12-05 ENCOUNTER — Telehealth: Payer: Self-pay | Admitting: *Deleted

## 2018-12-05 ENCOUNTER — Other Ambulatory Visit: Payer: Self-pay | Admitting: Pediatrics

## 2018-12-05 MED ORDER — ONDANSETRON HCL 4 MG PO TABS: 4.0000 mg | ORAL_TABLET | Freq: Three times a day (TID) | ORAL | 0 refills | Status: DC | PRN

## 2018-12-05 NOTE — Telephone Encounter (Signed)
Pt called office today asking for some medication to treat emesis. No fever noted. Emesis started today along with diarrhea and pt asking for Zofran to be sent to [facility]. Gave return precautions and pt agrees to follow up if condition worsens or persists.

## 2018-12-05 NOTE — Telephone Encounter (Signed)
Sent 4 mg tablets. Adults take 4-8 mg every 8 hours as needed. Children take 2 mg every 8 hours as needed.

## 2019-01-03 ENCOUNTER — Ambulatory Visit (INDEPENDENT_AMBULATORY_CARE_PROVIDER_SITE_OTHER): Payer: BLUE CROSS/BLUE SHIELD | Admitting: Family

## 2019-01-03 ENCOUNTER — Encounter: Payer: Self-pay | Admitting: Family

## 2019-01-03 VITALS — BP 105/67 | HR 96 | Ht 64.57 in | Wt 169.6 lb

## 2019-01-03 DIAGNOSIS — F4323 Adjustment disorder with mixed anxiety and depressed mood: Secondary | ICD-10-CM

## 2019-01-03 DIAGNOSIS — Z638 Other specified problems related to primary support group: Secondary | ICD-10-CM

## 2019-01-03 MED ORDER — BUSPIRONE HCL 5 MG PO TABS: 5.0000 mg | ORAL_TABLET | Freq: Two times a day (BID) | ORAL | 0 refills | Status: DC

## 2019-01-03 MED ORDER — SERTRALINE HCL 100 MG PO TABS: 150.0000 mg | ORAL_TABLET | Freq: Every day | ORAL | 1 refills | Status: DC

## 2019-01-03 NOTE — Progress Notes (Signed)
History was provided by the patient, mother and grandmother and child.  Kelly Powell is a 23 y.o. female who is here for medication management for adjustment disorder with mixed anxiety and depressed mood.   PCP confirmed? Yes.    No primary care provider on file.  HPI:   -taking zoloft 100 mg still having irritability and anxiety  -no si/hi, no cutting -breastfeeding - 6 month old  -working, stress at home, 5 kids  Review of Systems  Constitutional: Negative for fever and weight loss.  HENT: Negative for sore throat.   Eyes: Negative for blurred vision and pain.  Respiratory: Negative for shortness of breath.   Cardiovascular: Negative for chest pain and palpitations.  Gastrointestinal: Negative for abdominal pain and nausea.  Genitourinary: Negative for dysuria.  Musculoskeletal: Negative for joint pain and myalgias.  Skin: Negative for rash.  Neurological: Negative for dizziness and headaches.  Psychiatric/Behavioral: Negative for depression and suicidal ideas. The patient is nervous/anxious.      Patient Active Problem List   Diagnosis Date Noted  . Parenting stress 05/17/2018  . Adjustment disorder with mixed anxiety and depressed mood 05/16/2018    Current Outpatient Medications on File Prior to Visit  Medication Sig Dispense Refill  . butalbital-acetaminophen-caffeine (FIORICET, ESGIC) 50-325-40 MG tablet     . ibuprofen (ADVIL,MOTRIN) 800 MG tablet Take by mouth.    . norethindrone (MICRONOR,CAMILA,ERRIN) 0.35 MG tablet     . ondansetron (ZOFRAN) 4 MG tablet Take 1 tablet (4 mg total) by mouth every 8 (eight) hours as needed for nausea or vomiting. 20 tablet 0   No current facility-administered medications on file prior to visit.     Allergies  Allergen Reactions  . Bee Venom Hives    Physical Exam:    Vitals:   01/03/19 1008  BP: 105/67  Pulse: 96  Weight: 169 lb 9.6 oz (76.9 kg)  Height: 5' 4.57" (1.64 m)   Wt Readings from Last 3 Encounters:   01/03/19 169 lb 9.6 oz (76.9 kg)  09/26/18 184 lb 3.2 oz (83.6 kg)  06/20/18 198 lb 3.2 oz (89.9 kg)     Growth percentile SmartLinks can only be used for patients less than 42 years old. No LMP recorded.  Physical Exam Constitutional:      Appearance: She is not ill-appearing.  HENT:     Head: Normocephalic.     Nose: Nose normal.     Mouth/Throat:     Mouth: Mucous membranes are moist.  Neck:     Musculoskeletal: Normal range of motion.  Cardiovascular:     Rate and Rhythm: Normal rate and regular rhythm.     Heart sounds: No murmur.  Pulmonary:     Effort: Pulmonary effort is normal.  Musculoskeletal: Normal range of motion.  Skin:    General: Skin is warm and dry.     Findings: No rash.  Neurological:     General: No focal deficit present.     Mental Status: She is alert and oriented to person, place, and time.  Psychiatric:        Attention and Perception: Attention normal.        Mood and Affect: Mood is anxious.        Speech: Speech normal.        Behavior: Behavior normal.        Thought Content: Thought content normal.        Cognition and Memory: Cognition normal.  Judgment: Judgment normal.     Assessment/Plan: 1. Adjustment disorder with mixed anxiety and depressed mood -increase zoloft from 100 mg to 150 mg  -add buspar 5 mg BID, ok for lactating  -return precautions given, BBW reviewed -needs PHQSADS at next OV   2. Parenting stress -as above, praise and encouragement given

## 2019-01-03 NOTE — Patient Instructions (Signed)
Today we discussed your increased anxiety and feeling that your medication has plateaued again. We will increase your sertraline from 100 mg to 150 mg daily. Continue to take it at the same time you normally do.   We will add buspirone 5 mg twice daily also. This medication is to be taken every day, not just as needed. It is safe to use while lactating and should not affect your milk supply.   Please keep your scheduled follow up and let me know if you have any new or worsening symptoms. If that happens, I will need to see you sooner so we can discuss.   It was nice to meet you today! Let me know if you need anything else.

## 2019-01-05 ENCOUNTER — Encounter: Payer: Self-pay | Admitting: Family

## 2019-01-21 ENCOUNTER — Ambulatory Visit: Payer: Self-pay | Admitting: Pediatrics

## 2019-01-22 ENCOUNTER — Ambulatory Visit: Payer: BLUE CROSS/BLUE SHIELD | Admitting: Family

## 2019-01-31 ENCOUNTER — Encounter: Payer: Self-pay | Admitting: Family

## 2019-01-31 ENCOUNTER — Other Ambulatory Visit: Payer: Self-pay

## 2019-01-31 ENCOUNTER — Ambulatory Visit (INDEPENDENT_AMBULATORY_CARE_PROVIDER_SITE_OTHER): Payer: BLUE CROSS/BLUE SHIELD | Admitting: Family

## 2019-01-31 VITALS — BP 100/70 | HR 99 | Ht 65.35 in | Wt 167.6 lb

## 2019-01-31 DIAGNOSIS — F4322 Adjustment disorder with anxiety: Secondary | ICD-10-CM | POA: Diagnosis not present

## 2019-01-31 MED ORDER — BUSPIRONE HCL 5 MG PO TABS: 5.0000 mg | ORAL_TABLET | Freq: Two times a day (BID) | ORAL | 1 refills | Status: DC

## 2019-01-31 MED ORDER — SERTRALINE HCL 100 MG PO TABS: 150.0000 mg | ORAL_TABLET | Freq: Every day | ORAL | 1 refills | Status: DC

## 2019-01-31 NOTE — Patient Instructions (Addendum)
Thank you for visiting [place] today. We will not make changes to your medications. We have ordered 2 months of refills for both the sertraline and buspirone. We will plan on following up in 2 months, but please call us or schedule an appointment if your symptoms change and you feel you need to be seen sooner.

## 2019-01-31 NOTE — Progress Notes (Signed)
History was provided by the patient.  Kelly Powell is a 23 y.o. female with anxiety who is here for medication re-check after increase of sertraline and initiation of buspirone.  PCP confirmed? Yes.    Georges MouseJones, Christy M, NP  HPI: At most recent visit on 01/03/2019, sertraline was increased to 150mg  QD from 100 mg QD and buspirone 5mg  BID was initiated.  Since her last visit, she feels like everything has been improved and is going well at this time. She feels like her irritation levels have decreased and it is easier to cope when there are stressful situations at home.  She has noticed no side effects, including no GI side effects, no headaches, no changes to sleep (although has difficulty at baseline due to scheduling and competing demands).   Overall, she does express concern about staying on sertraline long-term. She does not feel that increasing the sertraline dose has made an appreciable change, and is skeptical that sertraline will work long-term. However, she does wish to continue breastfeeding at this time and does not want to change medications at this point. She will most likely plan to stop breastfeeding in the next 1-2 months, and then at that point is interested in trying a different medication.  ROS negative aside from as per HPI.  Patient Active Problem List   Diagnosis Date Noted  . Parenting stress 05/17/2018  . Adjustment disorder with mixed anxiety and depressed mood 05/16/2018    Current Outpatient Medications on File Prior to Visit  Medication Sig Dispense Refill  . butalbital-acetaminophen-caffeine (FIORICET, ESGIC) 50-325-40 MG tablet     . ibuprofen (ADVIL,MOTRIN) 800 MG tablet Take by mouth.    . norethindrone (MICRONOR,CAMILA,ERRIN) 0.35 MG tablet     . ondansetron (ZOFRAN) 4 MG tablet Take 1 tablet (4 mg total) by mouth every 8 (eight) hours as needed for nausea or vomiting. (Patient not taking: Reported on 01/31/2019) 20 tablet 0   No current  facility-administered medications on file prior to visit.     Allergies  Allergen Reactions  . Bee Venom Hives    Physical Exam:    Vitals:   01/31/19 0909  BP: 100/70  Pulse: 99  Weight: 167 lb 9.6 oz (76 kg)  Height: 5' 5.35" (1.66 m)    Growth percentile SmartLinks can only be used for patients less than 24 years old. No LMP recorded.  Physical Exam Constitutional:      General: She is not in acute distress.    Appearance: Normal appearance.  HENT:     Head: Normocephalic and atraumatic.     Right Ear: External ear normal.     Left Ear: External ear normal.     Nose: Nose normal. No congestion or rhinorrhea.     Mouth/Throat:     Mouth: Mucous membranes are moist.  Eyes:     General:        Right eye: No discharge.        Left eye: No discharge.     Extraocular Movements: Extraocular movements intact.     Conjunctiva/sclera: Conjunctivae normal.     Pupils: Pupils are equal, round, and reactive to light.  Neck:     Musculoskeletal: Normal range of motion.  Cardiovascular:     Rate and Rhythm: Normal rate and regular rhythm.     Pulses: Normal pulses.     Heart sounds: Normal heart sounds.  Pulmonary:     Effort: Pulmonary effort is normal. No respiratory distress.  Breath sounds: Normal breath sounds. No wheezing.  Abdominal:     General: There is no distension.     Palpations: Abdomen is soft.  Musculoskeletal: Normal range of motion.  Skin:    General: Skin is warm and dry.     Capillary Refill: Capillary refill takes less than 2 seconds.  Neurological:     General: No focal deficit present.     Mental Status: She is alert.      Assessment/Plan: Kelly Powell is a 23 y.o. female with anxiety who is here for medication re-check after increase of sertraline and initiation of buspirone. She reports improvement of her anxiety and stress since medication changes ~ 1 month ago, and wishes to continue current medication regimen at this point. She does  express desire to transition to a different plan after cessation of breastfeeding, although this will likely not occur for 1-2 more months.  # Continue sertraline 150 mg QD # Continue buspirone 5 mg BID # Consider alternative regimen after cessation of breastfeeding in 1-2 months # Follow-up in 2 months, appointment scheduled today     Mindi Curling, MD PhD Pediatrics Resident PGY3 [facility]

## 2019-02-18 NOTE — Progress Notes (Signed)
Attending Co-Signature.  I am the supervising provider and available for consultation as needed for the the nurse practitioner who assisted the resident with the assessment and management plan as documented.     Jasdeep Dejarnett F Tessy Pawelski, MD Adolescent Medicine Specialist   

## 2019-02-25 ENCOUNTER — Ambulatory Visit (INDEPENDENT_AMBULATORY_CARE_PROVIDER_SITE_OTHER): Payer: BLUE CROSS/BLUE SHIELD | Admitting: Pediatrics

## 2019-02-25 VITALS — BP 113/72 | HR 94 | Temp 97.0°F | Ht 65.35 in | Wt 174.6 lb

## 2019-02-25 DIAGNOSIS — L03031 Cellulitis of right toe: Secondary | ICD-10-CM

## 2019-02-25 MED ORDER — DOXYCYCLINE HYCLATE 100 MG PO CAPS: 100.0000 mg | ORAL_CAPSULE | Freq: Two times a day (BID) | ORAL | 0 refills | Status: DC

## 2019-02-25 MED FILL — DOXYCYCLINE HYC 100 MG CAPS: 100 | 7 days supply | Qty: 14 | Fill #0

## 2019-02-25 NOTE — Patient Instructions (Signed)
Paronychia  Paronychia is an infection of the skin. It happens near a fingernail or toenail. It may cause pain and swelling around the nail. In some cases, a fluid-filled bump (abscess) can form near or under the nail.  Usually, this condition is not serious, and it clears up with treatment.  Follow these instructions at home:  Wound care   Keep the affected area clean.   Soak the fingers or toes in warm water as told by your doctor. You may be told to do this for 20 minutes, 2-3 times a day.   Keep the area dry when you are not soaking it.   Do not try to drain a fluid-filled bump on your own.   Follow instructions from your doctor about how to take care of the affected area. Make sure you:  ? Wash your hands with soap and water before you change your bandage (dressing). If you cannot use soap and water, use hand sanitizer.  ? Change your bandage as told by your doctor.   If you had a fluid-filled bump and your doctor drained it, check the area every day for signs of infection. Check for:  ? Redness, swelling, or pain.  ? Fluid or blood.  ? Warmth.  ? Pus or a bad smell.  Medicines     Take over-the-counter and prescription medicines only as told by your doctor.   If you were prescribed an antibiotic medicine, take it as told by your doctor. Do not stop taking it even if you start to feel better.  General instructions   Avoid touching any chemicals.   Do not pick at the affected area.  Prevention   To prevent this condition from happening again:  ? Wear rubber gloves when putting your hands in water for washing dishes or other tasks.  ? Wear gloves if your hands might touch cleaners or chemicals.  ? Avoid injuring your nails or fingertips.  ? Do not bite your nails or tear hangnails.  ? Do not cut your nails very short.  ? Do not cut the skin at the base and sides of the nail (cuticles).  ? Use clean nail clippers or scissors when trimming nails.  Contact a doctor if:   You feel worse.   You do not get  better.   You have more fluid, blood, or pus coming from the affected area.   Your finger or knuckle is swollen or is hard to move.  Get help right away if you have:   A fever or chills.   Redness spreading from the affected area.   Pain in a joint or muscle.  Summary   Paronychia is an infection of the skin. It happens near a fingernail or toenail.   This condition may cause pain and swelling around the nail.   Soak the fingers or toes in warm water as told by your doctor.   Usually, this condition is not serious, and it clears up with treatment.  This information is not intended to replace advice given to you by your health care provider. Make sure you discuss any questions you have with your health care provider.  Document Released: 11/29/2009 Document Revised: 12/24/2017 Document Reviewed: 12/24/2017  Elsevier Interactive Patient Education  2019 Elsevier Inc[employer]

## 2019-02-25 NOTE — Addendum Note (Signed)
Addended by: Alfonso Ramus T on: 02/25/2019 02:02 PM   Modules accepted: Orders

## 2019-02-25 NOTE — Progress Notes (Signed)
History was provided by the patient.  Kelly Powell is a 23 y.o. female who is here for toe infection.  Georges Mouse, NP   HPI:  Pt reports got recent pedicure and is now having pain, swelling, redness and streaking up her foot from big toe. She has been trying to manipulate and drain it since last PM. She is a hospital employee. Denies fever, chills, vomiting, headaches or other constitutional sx.   No LMP recorded.  Review of Systems  Constitutional: Negative for chills, fever and malaise/fatigue.  Eyes: Negative for double vision.  Respiratory: Negative for shortness of breath.   Cardiovascular: Negative for chest pain and palpitations.  Gastrointestinal: Negative for abdominal pain, constipation, diarrhea, nausea and vomiting.  Genitourinary: Negative for dysuria.  Musculoskeletal: Positive for joint pain. Negative for myalgias.  Skin: Negative for rash.  Neurological: Negative for dizziness and headaches.  Endo/Heme/Allergies: Does not bruise/bleed easily.    Patient Active Problem List   Diagnosis Date Noted  . Parenting stress 05/17/2018  . Adjustment disorder with mixed anxiety and depressed mood 05/16/2018    Current Outpatient Medications on File Prior to Visit  Medication Sig Dispense Refill  . busPIRone (BUSPAR) 5 MG tablet Take 1 tablet (5 mg total) by mouth 2 (two) times daily. 60 tablet 1  . butalbital-acetaminophen-caffeine (FIORICET, ESGIC) 50-325-40 MG tablet     . ibuprofen (ADVIL,MOTRIN) 800 MG tablet Take by mouth.    . norethindrone (MICRONOR,CAMILA,ERRIN) 0.35 MG tablet     . ondansetron (ZOFRAN) 4 MG tablet Take 1 tablet (4 mg total) by mouth every 8 (eight) hours as needed for nausea or vomiting. (Patient not taking: Reported on 01/31/2019) 20 tablet 0  . sertraline (ZOLOFT) 100 MG tablet Take 1.5 tablets (150 mg total) by mouth daily. 45 tablet 1   No current facility-administered medications on file prior to visit.     Allergies  Allergen  Reactions  . Bee Venom Hives    Physical Exam:    Vitals:   02/25/19 0950  BP: 113/72  Pulse: 94  Temp: (!) 97 F (36.1 C)  Weight: 174 lb 9.6 oz (79.2 kg)  Height: 5' 5.35" (1.66 m)    Growth percentile SmartLinks can only be used for patients less than 10 years old.  Physical Exam Vitals signs and nursing note reviewed.  Constitutional:      General: She is not in acute distress.    Appearance: She is well-developed.  Neck:     Thyroid: No thyromegaly.  Cardiovascular:     Rate and Rhythm: Normal rate and regular rhythm.     Pulses:          Dorsalis pedis pulses are 2+ on the right side.     Heart sounds: No murmur.  Pulmonary:     Breath sounds: Normal breath sounds.  Abdominal:     Palpations: Abdomen is soft. There is no mass.     Tenderness: There is no abdominal tenderness. There is no guarding.  Musculoskeletal:     Right lower leg: No edema.     Left lower leg: No edema.       Feet:     Comments: Pain with palpation to right metatarsal of great toe.   Feet:     Right foot:     Skin integrity: Erythema and warmth present. No skin breakdown.     Toenail Condition: Right toenails are ingrown.     Comments: Erythema extends up to arch of foot Lymphadenopathy:  Cervical: No cervical adenopathy.  Skin:    General: Skin is warm.     Findings: No rash.  Neurological:     Mental Status: She is alert.     Comments: No tremor     Assessment/Plan: 1. Cellulitis of toe of right foot Low suspicion for any involvement of joint space but will watch closely given the streaking she is having up her foot. 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 Soaking and wound care instructed. Return precautions given. - doxycycline (VIBRAMYCIN) 100 MG capsule; Take 1 capsule (100 mg total) by mouth 2 (two) times daily.  Dispense: 14 capsule; Refill: 0

## 2019-03-08 ENCOUNTER — Other Ambulatory Visit: Payer: Self-pay | Admitting: Pediatrics

## 2019-04-08 ENCOUNTER — Ambulatory Visit (INDEPENDENT_AMBULATORY_CARE_PROVIDER_SITE_OTHER): Payer: BLUE CROSS/BLUE SHIELD | Admitting: Licensed Clinical Social Worker

## 2019-04-08 DIAGNOSIS — F4322 Adjustment disorder with anxiety: Secondary | ICD-10-CM
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
## 2019-04-11 ENCOUNTER — Ambulatory Visit (INDEPENDENT_AMBULATORY_CARE_PROVIDER_SITE_OTHER): Payer: BLUE CROSS/BLUE SHIELD | Admitting: Family

## 2019-04-11 ENCOUNTER — Other Ambulatory Visit: Payer: Self-pay

## 2019-04-11 DIAGNOSIS — F4323 Adjustment disorder with mixed anxiety and depressed mood: Secondary | ICD-10-CM | POA: Diagnosis not present

## 2019-04-11 DIAGNOSIS — G479 Sleep disorder, unspecified: Secondary | ICD-10-CM

## 2019-04-11 DIAGNOSIS — Z638 Other specified problems related to primary support group: Secondary | ICD-10-CM | POA: Diagnosis not present

## 2019-04-11 MED ORDER — HYDROXYZINE PAMOATE 25 MG PO CAPS: 25.0000 mg | ORAL_CAPSULE | Freq: Three times a day (TID) | ORAL | 1 refills | Status: DC | PRN

## 2019-04-11 NOTE — Progress Notes (Signed)
Virtual Visit via Video Note  I connected with Kelly Powell   on 04/11/19 at 10:00 AM EDT by a video enabled telemedicine application and verified that I am speaking with the correct person using two identifiers.   Location of patient/parent: home   I discussed the limitations of evaluation and management by telemedicine and the availability of in person appointments.  I discussed that the purpose of this phone visit is to provide medical care while limiting exposure to the novel coronavirus.  The patient expressed understanding and agreed to proceed.  Reason for visit: adjustment disorder with anxiety   History of Present Illness:  -a lot of family stress; see BH note from [place] re: her daughter's incident w niece. [place] authorities notified per Ty Hilts and pt.  -she is not sleeping, exhausted mentally and emotionally  -safe to herself  -taking sertraline 150 mg daily  -taking buspar 5 mg BID   Observations/Objective: pleasant, engaging; good eye contact  Assessment and Plan:  -discussed trial of hydroxyzine to help her sleep  -she is agreeable  -keep other meds the same -encouragement given; continue talking with Maisie Fus, therapy   Follow Up Instructions: needs follow up in 2 weeks    I discussed the assessment and treatment plan with the patient and/or parent/guardian. They were provided an opportunity to ask questions and all were answered. They agreed with the plan and demonstrated an understanding of the instructions.   They were advised to call back or seek an in-person evaluation in the emergency room if the symptoms worsen or if the condition fails to improve as anticipated.  I provided 18 minutes of non-face-to-face time during this encounter. I was located off-site during this encounter.  Georges Mouse, NP

## 2019-04-24 ENCOUNTER — Encounter: Payer: Self-pay | Admitting: Family

## 2019-04-24 ENCOUNTER — Other Ambulatory Visit: Payer: Self-pay | Admitting: Pediatrics

## 2019-04-24 MED ORDER — BUSPIRONE HCL 5 MG PO TABS: 5.0000 mg | ORAL_TABLET | Freq: Two times a day (BID) | ORAL | 1 refills | Status: DC

## 2019-04-24 NOTE — Telephone Encounter (Signed)
Will rout to correct pod, red pool.

## 2019-04-30 ENCOUNTER — Other Ambulatory Visit: Payer: Self-pay

## 2019-04-30 ENCOUNTER — Ambulatory Visit (INDEPENDENT_AMBULATORY_CARE_PROVIDER_SITE_OTHER): Payer: BLUE CROSS/BLUE SHIELD | Admitting: Family

## 2019-04-30 ENCOUNTER — Encounter: Payer: Self-pay | Admitting: Family

## 2019-04-30 DIAGNOSIS — F4323 Adjustment disorder with mixed anxiety and depressed mood: Secondary | ICD-10-CM | POA: Diagnosis not present

## 2019-04-30 DIAGNOSIS — Z638 Other specified problems related to primary support group: Secondary | ICD-10-CM | POA: Diagnosis not present

## 2019-04-30 DIAGNOSIS — G479 Sleep disorder, unspecified: Secondary | ICD-10-CM

## 2019-04-30 NOTE — Progress Notes (Signed)
Virtual Visit via Video Note  I connected with Kelly Powell   on 04/30/19 at 10:30 AM EDT by a video enabled telemedicine application and verified that I am speaking with the correct person using two identifiers.   Location of patient/parent: home   I discussed the limitations of evaluation and management by telemedicine and the availability of in person appointments.  I discussed that the purpose of this phone visit is to provide medical care while limiting exposure to the novel coronavirus.  The patient expressed understanding and agreed to proceed.  Reason for visit: medication follow-up; we added hydroxyzine to her medication on 4/17 because she was not sleeping well.   History of Present Illness:  -feels much better; has been sleeping -taking 2 (25 mg tablets, so 50 mg total) of hydroxyzine at night and sleep great -continues with buspar 5mg  BID and zoloft 150 mg daily  -no si/hi, no concerns -no headaches, n/v, no tremor   Observations/Objective: looks more rested, smiling, engaged in call  Assessment and Plan:  1. Adjustment disorder with mixed anxiety and depressed mood -no medication changes -reach out as needed; she is aware we have BH services as needed  2. Sleep trouble -continue with hydroxyzine 50 mg at bedtime  3. Parenting stress -continue with buspar 5 mg BID  -continue with zoloft 150 mg daily    Follow Up Instructions: she will reach out when refills needed or sooner as needed   I discussed the assessment and treatment plan with the patient and/or parent/guardian. They were provided an opportunity to ask questions and all were answered. They agreed with the plan and demonstrated an understanding of the instructions.   They were advised to call back or seek an in-person evaluation in the emergency room if the symptoms worsen or if the condition fails to improve as anticipated.  I provided 5 minutes of non-face-to-face time and 0 minutes of care coordination  during this encounter I was located on-site during this video encounter.  Georges Mouse, NP

## 2019-05-23 ENCOUNTER — Other Ambulatory Visit: Payer: Self-pay | Admitting: Family

## 2019-05-24 ENCOUNTER — Other Ambulatory Visit: Payer: Self-pay | Admitting: Family

## 2019-05-24 MED ORDER — BUSPIRONE HCL 5 MG PO TABS: 5.0000 mg | ORAL_TABLET | Freq: Two times a day (BID) | ORAL | 1 refills | Status: DC

## 2019-05-24 MED ORDER — SERTRALINE HCL 100 MG PO TABS: 150.0000 mg | ORAL_TABLET | Freq: Every day | ORAL | 1 refills | Status: DC

## 2019-05-24 MED ORDER — HYDROXYZINE PAMOATE 25 MG PO CAPS: ORAL_CAPSULE | ORAL | 1 refills | Status: DC

## 2019-06-25 ENCOUNTER — Other Ambulatory Visit: Payer: Self-pay | Admitting: Family

## 2019-07-29 ENCOUNTER — Other Ambulatory Visit: Payer: Self-pay | Admitting: Family

## 2019-10-03 ENCOUNTER — Other Ambulatory Visit: Payer: Self-pay | Admitting: Family

## 2019-10-24 ENCOUNTER — Encounter: Payer: Self-pay | Admitting: Family

## 2019-10-31 ENCOUNTER — Other Ambulatory Visit: Payer: Self-pay | Admitting: Family

## 2019-10-31 MED ORDER — SERTRALINE HCL 50 MG PO TABS: ORAL_TABLET | ORAL | 0 refills | Status: DC

## 2019-11-01 ENCOUNTER — Other Ambulatory Visit: Payer: Self-pay | Admitting: Family

## 2019-11-14 ENCOUNTER — Ambulatory Visit (INDEPENDENT_AMBULATORY_CARE_PROVIDER_SITE_OTHER): Payer: BC Managed Care – PPO | Admitting: Family

## 2019-11-14 DIAGNOSIS — F4323 Adjustment disorder with mixed anxiety and depressed mood: Secondary | ICD-10-CM

## 2019-11-14 DIAGNOSIS — G479 Sleep disorder, unspecified: Secondary | ICD-10-CM | POA: Diagnosis not present

## 2019-11-14 MED ORDER — HYDROXYZINE HCL 10 MG PO TABS: 10.0000 mg | ORAL_TABLET | Freq: Three times a day (TID) | ORAL | 1 refills | Status: DC | PRN

## 2019-11-14 MED ORDER — ESCITALOPRAM OXALATE 10 MG PO TABS: 10.0000 mg | ORAL_TABLET | Freq: Every day | ORAL | 1 refills | Status: DC

## 2019-11-14 NOTE — Patient Instructions (Signed)
Stop sertraline.  Start Lexapro 10 mg in AM.  Trial hydroxyzine 10 mg for breakthrough anxiety during day.

## 2019-11-15 ENCOUNTER — Encounter: Payer: Self-pay | Admitting: Family

## 2019-11-15 NOTE — Progress Notes (Signed)
THIS RECORD MAY CONTAIN CONFIDENTIAL INFORMATION THAT SHOULD NOT BE RELEASED WITHOUT REVIEW OF THE SERVICE PROVIDER.  Virtual Follow-Up Visit via Video Note  I connected with Kelly Powell   on 11/14/2019 at 10:30 AM EST by a video enabled telemedicine application and verified that I am speaking with the correct person using two identifiers.    This patient visit was completed through the use of an audio/video or telephone encounter in the setting of the State of Emergency due to the COVID-19 Pandemic.  I discussed that the purpose of this telehealth visit is to provide medical care while limiting exposure to the novel coronavirus.       I discussed the limitations of evaluation and management by telemedicine and the availability of in person appointments.    The patient expressed understanding and agreed to proceed.   The patient was physically located at home in [place] or a state in which I am permitted to provide care. The patient and/or parent/guardian understood that s/he may incur co-pays and cost sharing, and agreed to the telemedicine visit. The visit was reasonable and appropriate under the circumstances given the patient's presentation at the time.   The patient and/or parent/guardian has been advised of the potential risks and limitations of this mode of treatment (including, but not limited to, the absence of in-person examination) and has agreed to be treated using telemedicine. The patient's/patient's family's questions regarding telemedicine have been answered.    As this visit was completed in an ambulatory virtual setting, the patient and/or parent/guardian has also been advised to contact their provider's office for worsening conditions, and seek emergency medical treatment and/or call 911 if the patient deems either necessary.    Kelly Powell is a 23 y.o. female referred by Georges Mouse, NP here today for follow-up of adjustment disorder with mixed anxiety and  depressed mood.   History was provided by the patient.  PCP Confirmed?  yes  My Chart Activated?   yes    Plan from Last Visit:   buspar 5 mg  Sertraline 100 mg  Hydroxyzine 25 mg as needed for sleep/anxiety   Chief Complaint: Adjustment disorder with mixed anxiety and depressed mood   History of Present Illness:  -doesn't feel sertraline is working; felt that way and we added buspar and it did help  -interested in another SSRI; had gained 30+ lbs since June and now is down 12  -watching carbs and eating better -no compensatory behaviors described, no si/hi -work going well, just stressful  -just feels zoned out, easily irritated  -sleeps great when she takes hydroxyzine 25 at night, can't take during day - too sleepy  -has been taking sertraline at night  -no longer breastfeeding   Review of Systems  Constitutional: Negative for chills, fever and malaise/fatigue.  HENT: Negative for sore throat.   Eyes: Negative for blurred vision and pain.  Respiratory: Negative for cough and shortness of breath.   Cardiovascular: Negative for chest pain and palpitations.  Gastrointestinal: Negative for abdominal pain and nausea.  Genitourinary: Negative for dysuria and frequency.  Musculoskeletal: Negative for joint pain and myalgias.  Skin: Negative for rash.  Neurological: Negative for dizziness and headaches.  Psychiatric/Behavioral: Negative for suicidal ideas. The patient is nervous/anxious.      Allergies  Allergen Reactions  . Bee Venom Hives   Outpatient Medications Prior to Visit  Medication Sig Dispense Refill  . busPIRone (BUSPAR) 5 MG tablet TAKE ONE TABLET BY MOUTH TWICE  A DAY 60 tablet 2  . butalbital-acetaminophen-caffeine (FIORICET, ESGIC) 50-325-40 MG tablet     . doxycycline (VIBRAMYCIN) 100 MG capsule Take 1 capsule (100 mg total) by mouth 2 (two) times daily. 14 capsule 0  . hydrOXYzine (VISTARIL) 25 MG capsule TAKE 1 CAPSULE BY MOUTH THREE TIMES A DAY AS  NEEDED 90 capsule 0  . ibuprofen (ADVIL,MOTRIN) 800 MG tablet Take by mouth.    . norethindrone (MICRONOR,CAMILA,ERRIN) 0.35 MG tablet     . sertraline (ZOLOFT) 50 MG tablet Take 2 tablets (100 mg total) by mouth daily for 20 days, THEN 1 tablet (50 mg total) daily for 20 days. 60 tablet 0   No facility-administered medications prior to visit.      Patient Active Problem List   Diagnosis Date Noted  . Parenting stress 05/17/2018  . Adjustment disorder with mixed anxiety and depressed mood 05/16/2018    Past Medical History:  Reviewed and updated?  yes Past Medical History:  Diagnosis Date  . Medical history non-contributory     Family History: Reviewed and updated? yes Family History  Problem Relation Age of Onset  . Anxiety disorder Mother   . Anxiety disorder Father   . Anxiety disorder Sister   . ADD / ADHD Brother   . Anxiety disorder Brother      Visual Observations/Objective:   General Appearance: Well nourished well developed, in no apparent distress.  Eyes: conjunctiva no swelling or erythema ENT/Mouth: No hoarseness, No cough for duration of visit.  Neck: Supple  Respiratory: Respiratory effort normal, normal rate, no retractions or distress.   Cardio: Appears well-perfused, noncyanotic Musculoskeletal: no obvious deformity Skin: visible skin without rashes, ecchymosis, erythema Neuro: Awake and oriented X 3,  Psych:  normal affect, Insight and Judgment appropriate.    Assessment/Plan: 1. Adjustment disorder with mixed anxiety and depressed mood -switch from sertraline 100 to lexapro 10 mg -take lexapro in AM  -keep taking buspar 5 mg BID -take hydroxzyine 10 mg for breakthrough anxiety   2. Sleep trouble -continue taking hydroxyzine as needed for sleep   -reviewed return precautions  I discussed the assessment and treatment plan with the patient and/or parent/guardian.  They were provided an opportunity to ask questions and all were answered.  They  agreed with the plan and demonstrated an understanding of the instructions. They were advised to call back or seek an in-person evaluation in the emergency room if the symptoms worsen or if the condition fails to improve as anticipated.   Follow-up:   2 weeks video or sooner if needed.   Medical decision-making:   I spent 15 minutes on this telehealth visit inclusive of face-to-face video and care coordination time I was located remote in [place] during this encounter.   Parthenia Ames, NP    CC: Parthenia Ames, NP, Parthenia Ames, NP

## 2019-11-28 ENCOUNTER — Ambulatory Visit: Payer: BC Managed Care – PPO | Admitting: Family

## 2019-12-16 ENCOUNTER — Other Ambulatory Visit: Payer: Self-pay | Admitting: Family

## 2020-03-10 ENCOUNTER — Other Ambulatory Visit: Payer: Self-pay | Admitting: Pediatrics

## 2020-03-10 ENCOUNTER — Other Ambulatory Visit: Payer: Self-pay

## 2020-03-10 ENCOUNTER — Other Ambulatory Visit: Payer: Self-pay | Admitting: Family

## 2020-03-10 ENCOUNTER — Other Ambulatory Visit (HOSPITAL_COMMUNITY)
Admission: RE | Admit: 2020-03-10 | Discharge: 2020-03-10 | Disposition: A | Discharge status: Home / Self Care | Payer: BC Managed Care – PPO | Source: Ambulatory Visit | Attending: Psychology | Admitting: Psychology

## 2020-03-10 ENCOUNTER — Ambulatory Visit: Payer: BC Managed Care – PPO

## 2020-03-10 DIAGNOSIS — Z113 Encounter for screening for infections with a predominantly sexual mode of transmission: Secondary | ICD-10-CM | POA: Diagnosis not present

## 2020-03-10 DIAGNOSIS — N921 Excessive and frequent menstruation with irregular cycle: Secondary | ICD-10-CM

## 2020-03-10 NOTE — Addendum Note (Signed)
Addended by: Alycia Patten on: 03/10/2020 01:29 PM   Modules accepted: Orders

## 2020-03-10 NOTE — Addendum Note (Signed)
Addended by: Alycia Patten on: 03/10/2020 12:24 PM   Modules accepted: Orders

## 2020-03-10 NOTE — Progress Notes (Unsigned)
Patient in for same day concerns of breakthrough bleeding. Wants labs and pregnancy test.  LMP 3/1 Sexually active with no contraception.  No pain with intercourse.  FH of thyroid issues also.  No other associated symptoms.

## 2020-03-11 LAB — HIV ANTIBODY (ROUTINE TESTING W REFLEX): HIV 1&2 Ab, 4th Generation: NONREACTIVE

## 2020-03-11 LAB — RPR: RPR Ser Ql: NONREACTIVE

## 2020-03-11 LAB — TSH: TSH: 0.58 mIU/L

## 2020-03-11 LAB — URINE CYTOLOGY ANCILLARY ONLY
Chlamydia: NEGATIVE
Comment: NEGATIVE
Comment: NORMAL
Neisseria Gonorrhea: NEGATIVE

## 2020-03-12 LAB — TEST AUTHORIZATION

## 2020-03-12 LAB — WET PREP BY MOLECULAR PROBE
Candida species: NOT DETECTED
MICRO NUMBER:: 10266185
SPECIMEN QUALITY:: ADEQUATE
Trichomonas vaginosis: NOT DETECTED

## 2020-03-12 LAB — HCG, TOTAL, QUANTITATIVE: hCG, Beta Chain, Quant, S: <3 m[IU]/mL

## 2020-03-12 LAB — T4, FREE: Free T4: 1.2 ng/dL (ref 0.8–1.8)

## 2020-03-12 LAB — PREGNANCY, URINE: Preg Test, Ur: NEGATIVE

## 2020-03-15 ENCOUNTER — Other Ambulatory Visit: Payer: Self-pay | Admitting: Pediatrics

## 2020-03-15 MED ORDER — METRONIDAZOLE 500 MG PO TABS: 500.0000 mg | ORAL_TABLET | Freq: Two times a day (BID) | ORAL | 0 refills | Status: AC

## 2020-03-15 MED ORDER — METRONIDAZOLE 500 MG PO TABS: 500.0000 mg | ORAL_TABLET | Freq: Two times a day (BID) | ORAL | 0 refills | Status: DC

## 2020-03-15 MED FILL — metroNIDAZOLE 500 MG TABS: 500 | 7 days supply | Qty: 14 | Fill #0

## 2020-03-23 ENCOUNTER — Telehealth: Payer: Self-pay | Admitting: Family

## 2020-03-23 ENCOUNTER — Other Ambulatory Visit: Payer: Self-pay | Admitting: Pediatrics

## 2020-03-23 MED ORDER — CYCLOBENZAPRINE HCL 10 MG PO TABS: ORAL_TABLET | ORAL | 0 refills | Status: DC

## 2020-03-23 NOTE — Telephone Encounter (Signed)
Appointment made for April 29th for IUD insertion. Please send in flexeril, send to [facility]

## 2020-03-23 NOTE — Telephone Encounter (Signed)
Done. Realistically after 3 vaginal deliveries, she probably doesn't need it.

## 2020-03-30 ENCOUNTER — Other Ambulatory Visit: Payer: Self-pay | Admitting: Pediatrics

## 2020-04-16 ENCOUNTER — Other Ambulatory Visit: Payer: Self-pay | Admitting: Family

## 2020-04-16 ENCOUNTER — Encounter: Payer: Self-pay | Admitting: Family

## 2020-04-16 MED ORDER — ONDANSETRON 4 MG PO TBDP: 4.0000 mg | ORAL_TABLET | Freq: Three times a day (TID) | ORAL | 0 refills | Status: DC | PRN

## 2020-04-16 MED FILL — ONDANSETRON ODT 4 MG TABLET: 4 | 7 days supply | Qty: 20 | Fill #0

## 2020-04-22 ENCOUNTER — Other Ambulatory Visit: Payer: Self-pay

## 2020-04-22 ENCOUNTER — Ambulatory Visit (INDEPENDENT_AMBULATORY_CARE_PROVIDER_SITE_OTHER): Payer: BC Managed Care – PPO | Admitting: Family

## 2020-04-22 VITALS — BP 112/78 | HR 82 | Ht 66.34 in | Wt 185.8 lb

## 2020-04-22 DIAGNOSIS — Z3202 Encounter for pregnancy test, result negative: Secondary | ICD-10-CM | POA: Diagnosis not present

## 2020-04-22 DIAGNOSIS — Z30430 Encounter for insertion of intrauterine contraceptive device: Secondary | ICD-10-CM | POA: Diagnosis not present

## 2020-04-22 LAB — POCT URINE PREGNANCY: Preg Test, Ur: NEGATIVE

## 2020-04-22 MED ORDER — LEVONORGESTREL 20 MCG/24HR IU IUD: INTRAUTERINE_SYSTEM | Freq: Once | INTRAUTERINE | Status: AC

## 2020-04-22 NOTE — Patient Instructions (Signed)

## 2020-04-22 NOTE — Progress Notes (Signed)
History was provided by the patient.  Kelly Powell is a 23 y.o. female who is here for IUD insertion.   PCP confirmed? Yes.    Georges Mouse, NP  HPI:   -desires Mirena  -reports she had Depo, still within window -thinks she is having bleeding because of Depo -treated BV with Flagyl and then heavier spotting, bleeding  -denies cramping, no pain with intercourse   Review of Systems  Constitutional: Negative for chills, fever and malaise/fatigue.  HENT: Negative for sore throat.   Eyes: Negative for blurred vision and pain.  Cardiovascular: Negative for chest pain and palpitations.  Gastrointestinal: Negative for abdominal pain and nausea.  Genitourinary: Negative for dysuria and hematuria.  Musculoskeletal: Negative for joint pain and myalgias.  Skin: Negative for rash.  Neurological: Negative for dizziness and headaches.  Psychiatric/Behavioral: The patient is not nervous/anxious.      Patient Active Problem List   Diagnosis Date Noted  . Parenting stress 05/17/2018  . Adjustment disorder with mixed anxiety and depressed mood 05/16/2018    Current Outpatient Medications on File Prior to Visit  Medication Sig Dispense Refill  . busPIRone (BUSPAR) 5 MG tablet TAKE ONE TABLET BY MOUTH TWICE A DAY 60 tablet 2  . escitalopram (LEXAPRO) 10 MG tablet TAKE 1 TABLET BY MOUTH EVERY DAY 30 tablet 1  . hydrOXYzine (ATARAX/VISTARIL) 10 MG tablet Take 1 tablet (10 mg total) by mouth 3 (three) times daily as needed. 60 tablet 1  . hydrOXYzine (VISTARIL) 25 MG capsule TAKE 1 CAPSULE BY MOUTH THREE TIMES A DAY AS NEEDED 90 capsule 0  . butalbital-acetaminophen-caffeine (FIORICET, ESGIC) 50-325-40 MG tablet     . cyclobenzaprine (FLEXERIL) 10 MG tablet Take 1 tablet 4 hours before procedure with 800 mg ibuprofen. May take 1 tablet up to 4 hours after for cramping. (Patient not taking: Reported on 04/22/2020) 2 tablet 0  . doxycycline (VIBRAMYCIN) 100 MG capsule Take 1 capsule (100 mg  total) by mouth 2 (two) times daily. (Patient not taking: Reported on 04/22/2020) 14 capsule 0  . ibuprofen (ADVIL,MOTRIN) 800 MG tablet Take by mouth.    . norethindrone (MICRONOR,CAMILA,ERRIN) 0.35 MG tablet     . ondansetron (ZOFRAN ODT) 4 MG disintegrating tablet Take 1 tablet (4 mg total) by mouth every 8 (eight) hours as needed for nausea or vomiting. (Patient not taking: Reported on 04/22/2020) 20 tablet 0  . [DISCONTINUED] escitalopram (LEXAPRO) 10 MG tablet Take 1 tablet (10 mg total) by mouth daily. 30 tablet 1   No current facility-administered medications on file prior to visit.    Allergies  Allergen Reactions  . Bee Venom Hives    Physical Exam:    Vitals:   04/22/20 1128  BP: 112/78  Pulse: 82  Weight: 185 lb 12.8 oz (84.3 kg)  Height: 5' 6.34" (1.685 m)   Wt Readings from Last 3 Encounters:  04/22/20 185 lb 12.8 oz (84.3 kg)  02/25/19 174 lb 9.6 oz (79.2 kg)  01/31/19 167 lb 9.6 oz (76 kg)    Growth percentile SmartLinks can only be used for patients less than 15 years old. No LMP recorded.  Physical Exam Exam conducted with a chaperone present.  Constitutional:      Appearance: Normal appearance.  HENT:     Head: Normocephalic.  Eyes:     Extraocular Movements: Extraocular movements intact.     Pupils: Pupils are equal, round, and reactive to light.  Cardiovascular:     Rate and Rhythm: Normal  rate.  Pulmonary:     Effort: Pulmonary effort is normal.  Genitourinary:    Vagina: Normal.     Cervix: Cervical bleeding present. No discharge.     Uterus: Normal.   Musculoskeletal:        General: No swelling. Normal range of motion.  Skin:    General: Skin is warm and dry.     Findings: No rash.  Neurological:     General: No focal deficit present.     Mental Status: She is alert.  Psychiatric:        Mood and Affect: Mood normal.      Assessment/Plan: 1. Encounter for insertion of mirena IUD Mirena IUD Insertion   The pt presents for Mirena  IUD placement.  No contraindications for placement.   The patient took Flexeril 10 mg prior to appt.   No LMP recorded.  UHCG: negative, on Depo Last unprotected sex:  NA   Risks & benefits of IUD discussed  The IUD was purchased and supplied by [facility].  Packaging instructions supplied to patient  Consent form signed.  The patient denies any allergies to anesthetics or antiseptics.   Procedure:  Pt was placed in lithotomy position.  Speculum was inserted.  GC/CT swab was used to collect sample for STI testing.  Tenaculum was used to stabilize the cervix by clasping at 12 o'clock  Betadine was used to clean the cervix and cervical os.  Dilators were used x 3. The uterus was sounded to 7 cm.  Mirena was inserted using manufacturer provided applicator. Lot # documented in [facility]  Strings were trimmed to 3 cm external to os.  Tenaculum was removed.  Speculum was removed.   The patient was advised to move slowly from a supine to an upright position   The patient denied any concerns or complaints   The patient was instructed to schedule a follow-up appt in 1 month and to call sooner if any concerns.   The patient acknowledged agreement and understanding of the plan.   - IUD Insertion; Future - C. trachomatis/N. gonorrhoeae RNA  2. Negative pregnancy test Negative  - POCT urine pregnancy

## 2020-04-23 LAB — C. TRACHOMATIS/N. GONORRHOEAE RNA
C. trachomatis RNA, TMA: NOT DETECTED
N. gonorrhoeae RNA, TMA: NOT DETECTED

## 2020-04-30 ENCOUNTER — Encounter: Payer: Self-pay | Admitting: Family

## 2020-05-05 ENCOUNTER — Other Ambulatory Visit: Payer: Self-pay | Admitting: Family

## 2020-05-19 ENCOUNTER — Encounter: Payer: Self-pay | Admitting: Family

## 2020-05-19 ENCOUNTER — Other Ambulatory Visit: Payer: Self-pay

## 2020-05-19 ENCOUNTER — Telehealth (INDEPENDENT_AMBULATORY_CARE_PROVIDER_SITE_OTHER): Payer: BC Managed Care – PPO | Admitting: Family

## 2020-05-19 DIAGNOSIS — N921 Excessive and frequent menstruation with irregular cycle: Secondary | ICD-10-CM | POA: Diagnosis not present

## 2020-05-19 DIAGNOSIS — F4323 Adjustment disorder with mixed anxiety and depressed mood: Secondary | ICD-10-CM

## 2020-05-19 DIAGNOSIS — Z975 Presence of (intrauterine) contraceptive device: Secondary | ICD-10-CM | POA: Diagnosis not present

## 2020-05-19 MED ORDER — METRONIDAZOLE 0.75 % VA GEL: 1.0000 | Freq: Two times a day (BID) | VAGINAL | 0 refills | Status: DC

## 2020-05-19 NOTE — Progress Notes (Signed)
This note is not being shared with the patient for the following reason: To respect privacy (The patient or proxy has requested that the information not be shared).  THIS RECORD MAY CONTAIN CONFIDENTIAL INFORMATION THAT SHOULD NOT BE RELEASED WITHOUT REVIEW OF THE SERVICE PROVIDER.  Virtual Follow-Up Visit via Video Note  I connected with Kelly Powell   on 05/19/20 at 10:00 AM EDT by a video enabled telemedicine application and verified that I am speaking with the correct person using two identifiers.   Patient/parent location: home   I discussed the limitations of evaluation and management by telemedicine and the availability of in person appointments.  I discussed that the purpose of this telehealth visit is to provide medical care while limiting exposure to the novel coronavirus.  The patient expressed understanding and agreed to proceed.   ICIS BUDREAU is a 24 y.o. female referred by Parthenia Ames, NP here today for follow-up of breakhrough bleeding with IUD, adjustment disorder with mixed anxiety and depressed mood.   History was provided by the patient.  Plan from Last Visit:   -Mirena IUD insertion on 04/22/20 -continues on Lexapro 10 mg, Buspar 5 mg BID, vistaril 10 mg PRN, 25 mg for sleep   Chief Complaint: -still having bleeding from IUD insertion -BV-like symptoms   History of Present Illness:  -overall things are good; split with husband and getting along well co-parentling -missing her relationship with family members because of new BF relationship -happy and safe in relationship; overall really likes having time for herself now -no si/hi; using vistaril as prescribed for breakthrough anxiety  -feels she may have BV because of spotting with IUD insertion; some discharge and smell; her labs were negative for STI at insertion   Review of Systems  Constitutional: Negative for chills, fever and malaise/fatigue.  HENT: Negative for sore throat.   Eyes: Negative for  blurred vision and pain.  Respiratory: Negative for shortness of breath.   Gastrointestinal: Negative for abdominal pain and nausea.  Genitourinary: Negative for dysuria.  Musculoskeletal: Negative for myalgias.  Neurological: Negative for dizziness and headaches.  Endo/Heme/Allergies: Does not bruise/bleed easily.  Psychiatric/Behavioral: Negative for depression and suicidal ideas. The patient is not nervous/anxious.      Allergies  Allergen Reactions  . Bee Venom Hives   Outpatient Medications Prior to Visit  Medication Sig Dispense Refill  . busPIRone (BUSPAR) 5 MG tablet TAKE ONE TABLET BY MOUTH TWICE A DAY 60 tablet 2  . butalbital-acetaminophen-caffeine (FIORICET, ESGIC) 50-325-40 MG tablet     . cyclobenzaprine (FLEXERIL) 10 MG tablet Take 1 tablet 4 hours before procedure with 800 mg ibuprofen. May take 1 tablet up to 4 hours after for cramping. (Patient not taking: Reported on 04/22/2020) 2 tablet 0  . doxycycline (VIBRAMYCIN) 100 MG capsule Take 1 capsule (100 mg total) by mouth 2 (two) times daily. (Patient not taking: Reported on 04/22/2020) 14 capsule 0  . escitalopram (LEXAPRO) 10 MG tablet TAKE 1 TABLET BY MOUTH EVERY DAY 30 tablet 1  . hydrOXYzine (ATARAX/VISTARIL) 10 MG tablet Take 1 tablet (10 mg total) by mouth 3 (three) times daily as needed. 60 tablet 1  . hydrOXYzine (VISTARIL) 25 MG capsule TAKE 1 CAPSULE BY MOUTH THREE TIMES A DAY AS NEEDED 90 capsule 0  . ibuprofen (ADVIL,MOTRIN) 800 MG tablet Take by mouth.    . norethindrone (MICRONOR,CAMILA,ERRIN) 0.35 MG tablet     . ondansetron (ZOFRAN ODT) 4 MG disintegrating tablet Take 1 tablet (4 mg  total) by mouth every 8 (eight) hours as needed for nausea or vomiting. (Patient not taking: Reported on 04/22/2020) 20 tablet 0   No facility-administered medications prior to visit.     Patient Active Problem List   Diagnosis Date Noted  . Parenting stress 05/17/2018  . Adjustment disorder with mixed anxiety and depressed  mood 05/16/2018   The following portions of the patient's history were reviewed and updated as appropriate: allergies, current medications, past family history, past medical history, past social history, past surgical history and problem list.  Visual Observations/Objective:   General Appearance: Well nourished well developed, in no apparent distress.  Eyes: conjunctiva no swelling or erythema ENT/Mouth: No hoarseness, No cough for duration of visit.  Neck: Supple  Respiratory: Respiratory effort normal, normal rate, no retractions or distress.   Cardio: Appears well-perfused, noncyanotic Musculoskeletal: no obvious deformity Skin: visible skin without rashes, ecchymosis, erythema Neuro: Awake and oriented X 3,  Psych:  normal affect, Insight and Judgment appropriate.    Assessment/Plan: 1. Adjustment disorder with mixed anxiety and depressed mood -stable on current medications; no indications for change at this time  2. Breakthrough bleeding with IUD -will treat with metrogel for presumed BV; patient elects gel over pills -discussed need for PAP; patient will come in for this and then transfer care to OB/GYN for continuity of care   I discussed the assessment and treatment plan with the patient and/or parent/guardian.  They were provided an opportunity to ask questions and all were answered.  They agreed with the plan and demonstrated an understanding of the instructions. They were advised to call back or seek an in-person evaluation in the emergency room if the symptoms worsen or if the condition fails to improve as anticipated.   Follow-up:   Schedule PAP at patient's convenience.   Medical decision-making:   I spent 15 minutes on this telehealth visit inclusive of face-to-face video and care coordination time I was located remote during this encounter.   Georges Mouse, NP    CC: Georges Mouse, NP, Georges Mouse, NP

## 2020-05-21 ENCOUNTER — Encounter: Payer: Self-pay | Admitting: Family

## 2020-05-27 ENCOUNTER — Ambulatory Visit: Payer: BC Managed Care – PPO | Admitting: Family

## 2020-07-01 ENCOUNTER — Ambulatory Visit: Payer: BC Managed Care – PPO | Admitting: Family

## 2020-08-12 ENCOUNTER — Other Ambulatory Visit: Payer: Self-pay | Admitting: Family

## 2020-08-12 ENCOUNTER — Other Ambulatory Visit: Payer: Self-pay | Admitting: Pediatrics

## 2020-09-14 ENCOUNTER — Encounter: Payer: Self-pay | Admitting: Family

## 2020-09-17 ENCOUNTER — Encounter: Payer: Self-pay | Admitting: Obstetrics and Gynecology

## 2020-09-17 ENCOUNTER — Ambulatory Visit (INDEPENDENT_AMBULATORY_CARE_PROVIDER_SITE_OTHER): Payer: BC Managed Care – PPO | Admitting: Obstetrics and Gynecology

## 2020-09-17 ENCOUNTER — Other Ambulatory Visit: Payer: Self-pay

## 2020-09-17 ENCOUNTER — Ambulatory Visit (HOSPITAL_BASED_OUTPATIENT_CLINIC_OR_DEPARTMENT_OTHER)
Admission: RE | Admit: 2020-09-17 | Discharge: 2020-09-17 | Disposition: A | Discharge status: Home / Self Care | Payer: BC Managed Care – PPO | Source: Ambulatory Visit | Attending: Obstetrics and Gynecology | Admitting: Obstetrics and Gynecology

## 2020-09-17 ENCOUNTER — Other Ambulatory Visit (HOSPITAL_COMMUNITY)
Admission: RE | Admit: 2020-09-17 | Discharge: 2020-09-17 | Disposition: A | Discharge status: Home / Self Care | Payer: BC Managed Care – PPO | Source: Ambulatory Visit | Attending: Obstetrics and Gynecology | Admitting: Obstetrics and Gynecology

## 2020-09-17 VITALS — BP 114/74 | HR 102 | Ht 66.0 in | Wt 199.0 lb

## 2020-09-17 DIAGNOSIS — T8332XA Displacement of intrauterine contraceptive device, initial encounter: Secondary | ICD-10-CM

## 2020-09-17 DIAGNOSIS — R399 Unspecified symptoms and signs involving the genitourinary system: Secondary | ICD-10-CM | POA: Diagnosis not present

## 2020-09-17 DIAGNOSIS — N898 Other specified noninflammatory disorders of vagina: Secondary | ICD-10-CM

## 2020-09-17 DIAGNOSIS — N390 Urinary tract infection, site not specified: Secondary | ICD-10-CM | POA: Diagnosis not present

## 2020-09-17 DIAGNOSIS — N939 Abnormal uterine and vaginal bleeding, unspecified: Secondary | ICD-10-CM

## 2020-09-17 DIAGNOSIS — X58XXXA Exposure to other specified factors, initial encounter: Secondary | ICD-10-CM | POA: Diagnosis not present

## 2020-09-17 DIAGNOSIS — F4323 Adjustment disorder with mixed anxiety and depressed mood: Secondary | ICD-10-CM

## 2020-09-17 LAB — POCT URINALYSIS DIPSTICK
Bilirubin, UA: NEGATIVE
Glucose, UA: NEGATIVE
Ketones, UA: NEGATIVE
Nitrite, UA: POSITIVE
Protein, UA: NEGATIVE
Spec Grav, UA: >=1.03 — AB (ref 1.010–1.025)
Urobilinogen, UA: 0.2 E.U./dL
pH, UA: 5 (ref 5.0–8.0)

## 2020-09-17 LAB — HCG, QUANTITATIVE, PREGNANCY: HCG, Total, QN: <3 m[IU]/mL

## 2020-09-17 MED ORDER — SULFAMETHOXAZOLE-TRIMETHOPRIM 800-160 MG PO TABS: 1.0000 | ORAL_TABLET | Freq: Two times a day (BID) | ORAL | 0 refills | Status: AC

## 2020-09-17 MED ORDER — SERTRALINE HCL 50 MG PO TABS: 25.0000 mg | ORAL_TABLET | Freq: Every day | ORAL | 2 refills | Status: AC

## 2020-09-17 MED ORDER — PHENAZOPYRIDINE HCL 200 MG PO TABS: 200.0000 mg | ORAL_TABLET | Freq: Three times a day (TID) | ORAL | 0 refills | Status: AC | PRN

## 2020-09-17 MED ORDER — NORGESTIMATE-ETH ESTRADIOL 0.25-35 MG-MCG PO TABS: 1.0000 | ORAL_TABLET | Freq: Every day | ORAL | 0 refills | Status: AC

## 2020-09-17 NOTE — Progress Notes (Signed)
GYNECOLOGY ANNUAL PREVENTATIVE CARE ENCOUNTER NOTE  History:     Kelly Powell is a 24 y.o. [phone] female here for urgency and frequency x 1 week. Also here to establish care with our office. [facility] was [facility] by [facility]. Had an IUD placed April 2021 would like to make sure IUD is still in the uterus, and would like to have STI testing. She is divorced, 3 children, has 1 partner.  Hx of anxiety, excess worry. Was taking Lexapro however stopped that 1 week ago "cold [place]". She reports not like the way lexapro makes her feels. She would like to go back on Zoloft which worked well for her. She does not have a therapist, she has no signs of SI or homicidal ideation. She feels safe in her environment.  Reports abnormal discharge with abnormal spotting off and on, partner reports he has not been able to feel IUD strings during intercourse.   Gynecologic History No LMP recorded. (Menstrual status: IUD). April 2021 Contraception: IUD Last Pap: 2018 Results were: normal Last mammogram: NA  Obstetric History OB History  Gravida Para Term Preterm AB Living  4 3 3   1 3   SAB TAB Ectopic Multiple Live Births  1            # Outcome Date GA Lbr Len/2nd Weight Sex Delivery Anes PTL Lv  4 SAB           3 Term           2 Term           1 Term             Past Medical History:  Diagnosis Date   Anxiety    Depression    Medical history non-contributory     History reviewed. No pertinent surgical history.  Current Outpatient Medications on File Prior to Visit  Medication Sig Dispense Refill   levonorgestrel (MIRENA) 20 MCG/24HR IUD 1 each by Intrauterine route once.     [DISCONTINUED] busPIRone (BUSPAR) 5 MG tablet TAKE ONE TABLET BY MOUTH TWICE A DAY 60 tablet 2   [DISCONTINUED] escitalopram (LEXAPRO) 10 MG tablet Take 1 tablet (10 mg total) by mouth daily. 30 tablet 1   [DISCONTINUED] escitalopram (LEXAPRO) 10 MG tablet TAKE 1 TABLET BY MOUTH EVERY DAY 30 tablet  1   No current facility-administered medications on file prior to visit.    Allergies  Allergen Reactions   Bee Venom Hives    Social History:  reports that she has never smoked. She has never used smokeless tobacco. She reports current alcohol use. She reports that she does not use drugs.  Family History  Problem Relation Age of Onset   Anxiety disorder Mother    Anxiety disorder Father    Anxiety disorder Sister    ADD / ADHD Brother    Anxiety disorder Brother     The following portions of the patient's history were reviewed and updated as appropriate: allergies, current medications, past family history, past medical history, past social history, past surgical history and problem list.  Review of Systems Pertinent items noted in HPI and remainder of comprehensive ROS otherwise negative.  Physical Exam:  BP 114/74    Pulse (!) 102    Ht 5\' 6"  (1.676 m)    Wt 199 lb (90.3 kg)    BMI 32.12 kg/m  CONSTITUTIONAL: Well-developed, well-nourished female in no acute distress.  HENT:  Normocephalic, atraumatic, External right and  left ear normal. Oropharynx is clear and moist SKIN: Skin is warm and dry. No rash noted. Not diaphoretic. No erythema. No pallor. MUSCULOSKELETAL: Normal range of motion.  NEUROLOGIC: Alert and oriented to person, place, and time. PSYCHIATRIC: Normal mood and affect. Normal behavior. Normal judgment and thought content. CARDIOVASCULAR: Normal heart rate noted, regular rhythm RESPIRATORY: Clear to auscultation bilaterally. Effort and breath sounds normal, no problems with respiration noted. BREASTS: Symmetric in size. No masses, tenderness, skin changes, nipple drainage, or lymphadenopathy bilaterally. Performed in the presence of a chaperone. ABDOMEN: Soft, no distention noted.  No tenderness, rebound or guarding.  PELVIC: Normal appearing external genitalia and urethral meatus; normal appearing vaginal mucosa and cervix.  Copious mucoid vaginal  discharge noted, IUD strings not visualized. Normal uterine size, no other palpable masses, no uterine or adnexal tenderness. IUD string not palpated.  Performed in the presence of a chaperone.   Assessment and Plan:   1. UTI symptoms  - Urine Culture - POCT Urinalysis Dipstick - Rx: Pyridium, Bactrim    2. Vaginal discharge  - Cervicovaginal ancillary only( [place]) - POCT urine pregnancy  3. Vaginal bleeding  - No bleeding on exam today. - POCT urine pregnancy - B-HCG Quant  5. Intrauterine contraceptive device threads lost, initial encounter  - US PELVIC COMPLETE WITH TRANSVAGINAL; Future - B-HCG Quant - Attempted bedside pelvic US with patient consent. Some issue with equipement. Unsure of IUD string visualized d/t picture/ grain. Will order formal.   6. Adjustment disorder with mixed anxiety and depressed mood  Rx: Zoloft, start with 25 mg and increase weekly until therapeutic dose of 100 mcg.  Call the office with any abnormal thoughts; changes in mood    Schedule Annual exam  Please refer to After Visit Summary for other counseling recommendations.      Antasia Haider, Harolyn Rutherford, NP [facility] [employer], [facility]

## 2020-09-17 NOTE — Progress Notes (Signed)
Pt c/o bleeding with IUD and UTI symptoms. Urinalysis dipstick abnormal. Pt request STI testing off pap smear but, declines STI blood work. Pt has never had pap smear.

## 2020-09-19 LAB — URINE CULTURE
MICRO NUMBER:: 10992728
SPECIMEN QUALITY:: ADEQUATE

## 2020-09-20 LAB — POCT URINE PREGNANCY: Preg Test, Ur: NEGATIVE

## 2020-09-21 LAB — CERVICOVAGINAL ANCILLARY ONLY
Bacterial Vaginitis (gardnerella): NEGATIVE
Candida Glabrata: NEGATIVE
Candida Vaginitis: POSITIVE — AB
Chlamydia: POSITIVE — AB
Comment: NEGATIVE
Comment: NEGATIVE
Comment: NEGATIVE
Comment: NEGATIVE
Comment: NEGATIVE
Comment: NORMAL
Neisseria Gonorrhea: NEGATIVE
Trichomonas: NEGATIVE

## 2020-09-22 ENCOUNTER — Telehealth: Payer: Self-pay | Admitting: *Deleted

## 2020-09-22 ENCOUNTER — Encounter: Payer: Self-pay | Admitting: *Deleted

## 2020-09-22 ENCOUNTER — Other Ambulatory Visit: Payer: Self-pay | Admitting: Obstetrics and Gynecology

## 2020-09-22 MED ORDER — AZITHROMYCIN 500 MG PO TABS: 1000.0000 mg | ORAL_TABLET | Freq: Once | ORAL | 0 refills | Status: DC

## 2020-09-22 MED ORDER — FLUCONAZOLE 150 MG PO TABS: 150.0000 mg | ORAL_TABLET | Freq: Every day | ORAL | 0 refills | Status: AC

## 2020-09-22 MED ORDER — AZITHROMYCIN 500 MG PO TABS: 1000.0000 mg | ORAL_TABLET | Freq: Once | ORAL | 0 refills | Status: AC

## 2020-09-22 NOTE — Progress Notes (Signed)
Expedited partner therapy, confirmed no allergies.    Duane Lope, NP 09/22/2020 4:40 PM

## 2020-09-22 NOTE — Telephone Encounter (Signed)
Spoke with patient via her phone and informed her of having tested positive for Chlamydia.  Her RX was sent to her pharmacy.  She is aware that her partners also will need treatment.  Pt states that she is ware and has no questions at this time.

## 2020-09-22 NOTE — Progress Notes (Signed)
Yeast on wet prep Rx: Diflucan   Kemba Hoppes, Victorino Dike I, NP 09/22/2020 8:10 AM

## 2020-09-22 NOTE — Telephone Encounter (Signed)
-----   Message from Duane Lope, NP sent at 09/22/2020  8:08 AM EDT ----- Good morning,  Is there any way you all could call Kelly Powell today and inform her of her results. I have sent the Rx for azithromycin to her pharmacy. If she has questions for me I am happy to call her later today when I'm in the office after 1. Thank you!  Victorino Dike

## 2020-09-22 NOTE — Progress Notes (Signed)
Positive Chlamydia Rx sent. [facility] office to call patient to notify.    Venia Carbon I, NP 09/22/2020 8:08 AM

## 2020-12-06 ENCOUNTER — Telehealth: Payer: BC Managed Care – PPO | Admitting: Family

## 2020-12-06 DIAGNOSIS — J029 Acute pharyngitis, unspecified: Secondary | ICD-10-CM

## 2020-12-06 MED ORDER — AMOXICILLIN 500 MG PO CAPS: 500.0000 mg | ORAL_CAPSULE | Freq: Two times a day (BID) | ORAL | 0 refills | Status: AC

## 2020-12-06 NOTE — Progress Notes (Signed)

## 2020-12-16 ENCOUNTER — Ambulatory Visit: Payer: BC Managed Care – PPO | Admitting: Pediatrics

## 2021-01-03 DIAGNOSIS — R102 Pelvic and perineal pain: Secondary | ICD-10-CM | POA: Diagnosis not present

## 2021-01-03 DIAGNOSIS — F419 Anxiety disorder, unspecified: Secondary | ICD-10-CM | POA: Diagnosis not present

## 2021-01-03 DIAGNOSIS — F331 Major depressive disorder, recurrent, moderate: Secondary | ICD-10-CM | POA: Diagnosis not present

## 2021-01-03 DIAGNOSIS — Z8619 Personal history of other infectious and parasitic diseases: Secondary | ICD-10-CM | POA: Diagnosis not present

## 2021-02-04 DIAGNOSIS — Z0000 Encounter for general adult medical examination without abnormal findings: Secondary | ICD-10-CM | POA: Diagnosis not present

## 2021-02-04 DIAGNOSIS — F419 Anxiety disorder, unspecified: Secondary | ICD-10-CM | POA: Diagnosis not present

## 2021-02-07 DIAGNOSIS — Z0000 Encounter for general adult medical examination without abnormal findings: Secondary | ICD-10-CM | POA: Diagnosis not present

## 2021-02-07 DIAGNOSIS — F419 Anxiety disorder, unspecified: Secondary | ICD-10-CM | POA: Diagnosis not present

## 2021-02-07 DIAGNOSIS — Z13220 Encounter for screening for lipoid disorders: Secondary | ICD-10-CM | POA: Diagnosis not present

## 2021-02-11 ENCOUNTER — Other Ambulatory Visit (HOSPITAL_COMMUNITY): Payer: Self-pay | Admitting: Physician Assistant

## 2021-02-11 MED FILL — clonazePAM 0.5 MG TABS: 0.5 | 10 days supply | Qty: 20 | Fill #0

## 2021-03-03 IMAGING — US US PELVIS COMPLETE WITH TRANSVAGINAL
2 of 3 series · 13 of 25 positions shown · non-contrast
Comparison: None

CLINICAL DATA: IUD threads are lost.  History of bladder infection.



[Series 1: us pelvis complete with transvaginal · 12 of 73 slices shown (1 of 2)]
[im 1/73]
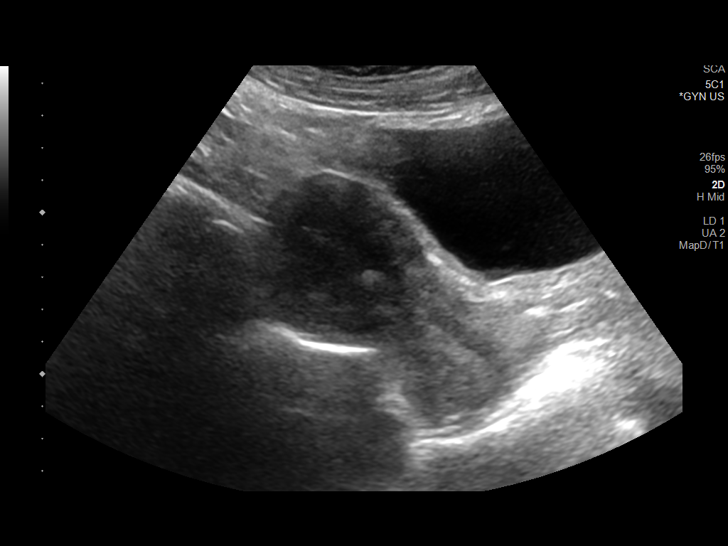
[im 7/73]
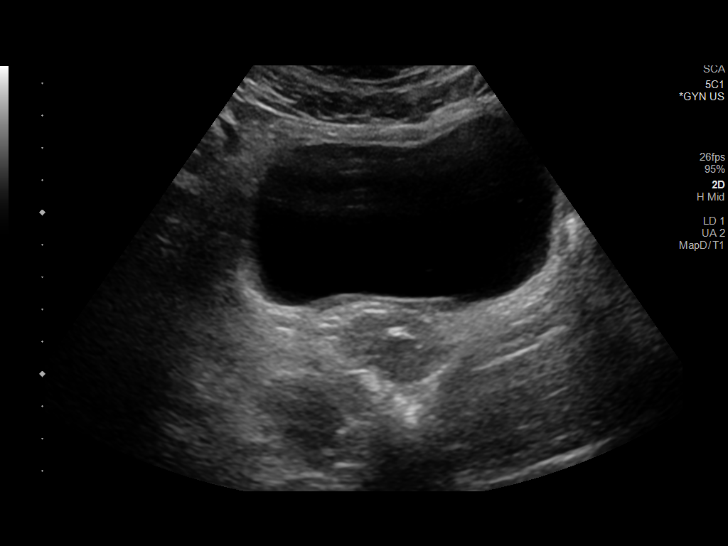
[im 14/73]
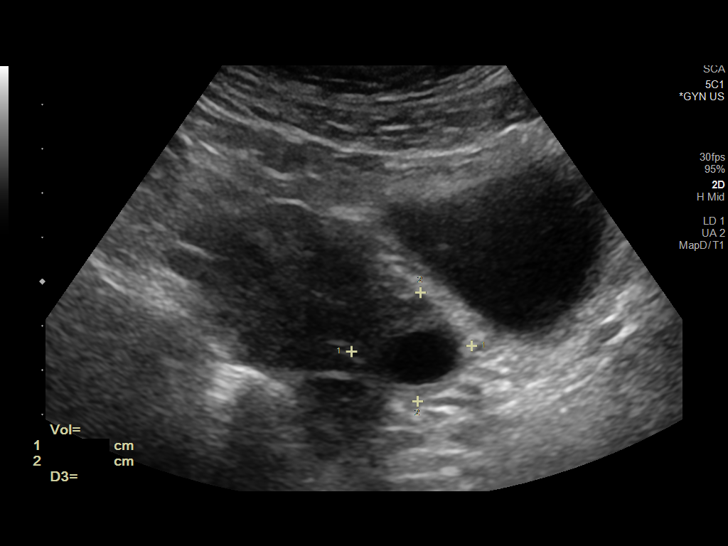
[im 20/73]
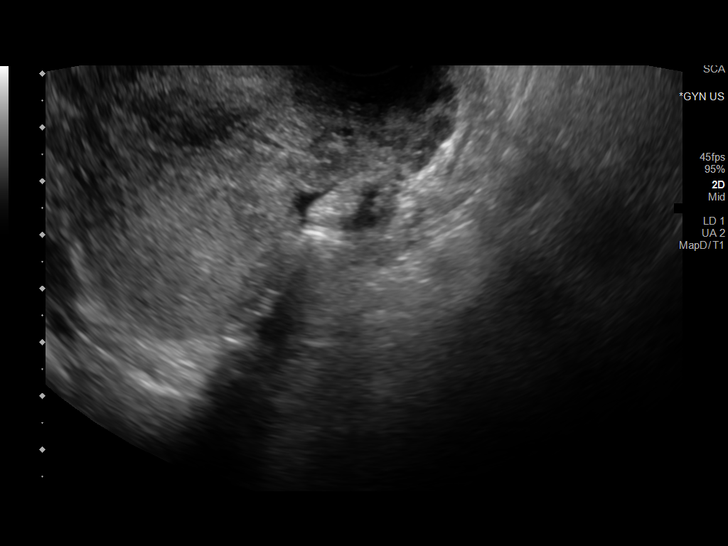
[im 27/73]
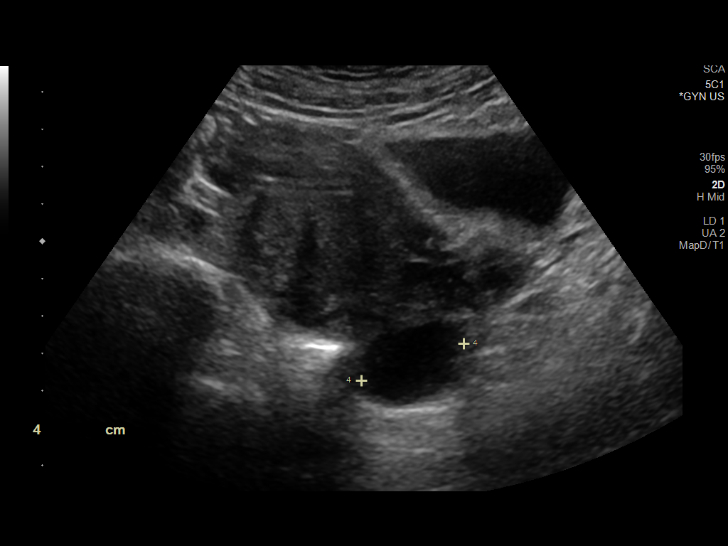
[im 33/73]
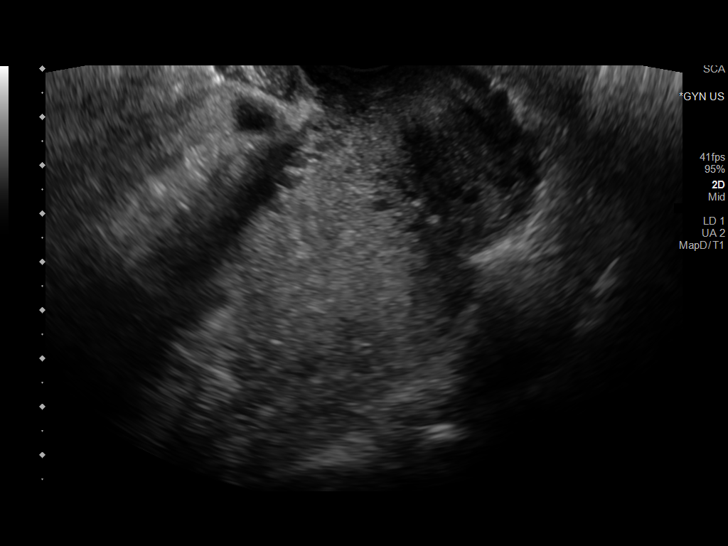
[im 40/73]
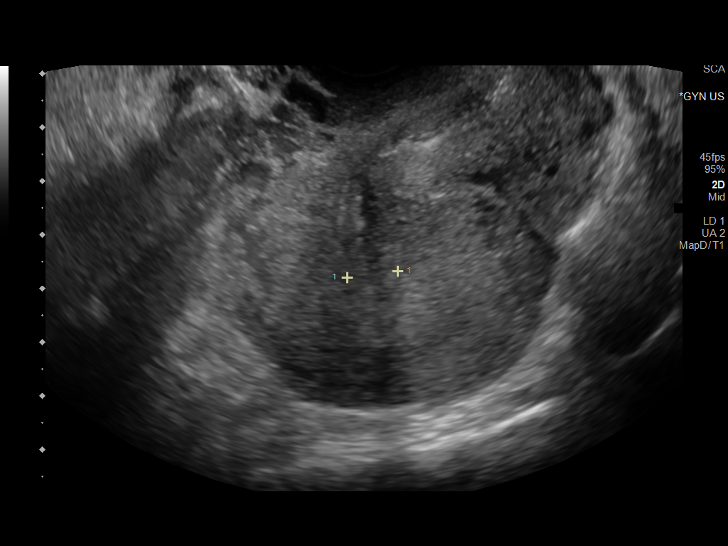
[im 46/73]
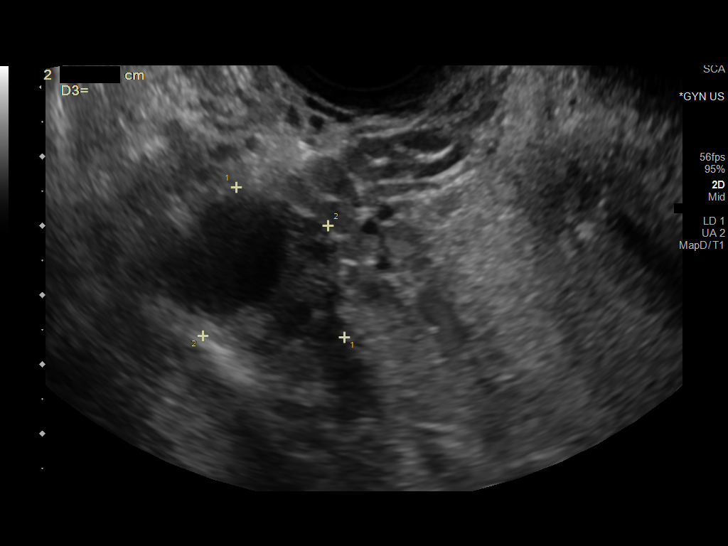
[im 53/73]
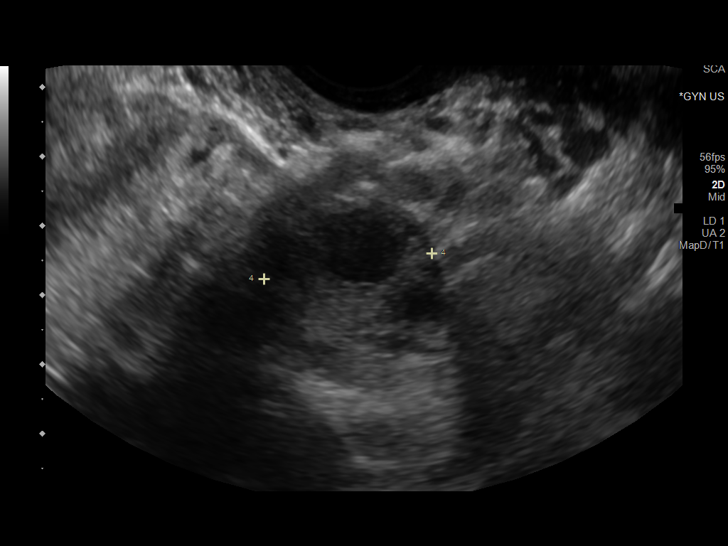
[im 59/73]
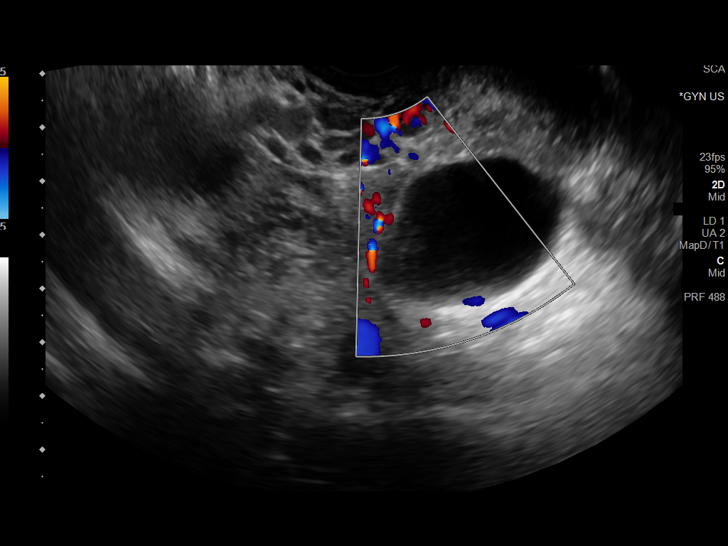
[im 66/73]
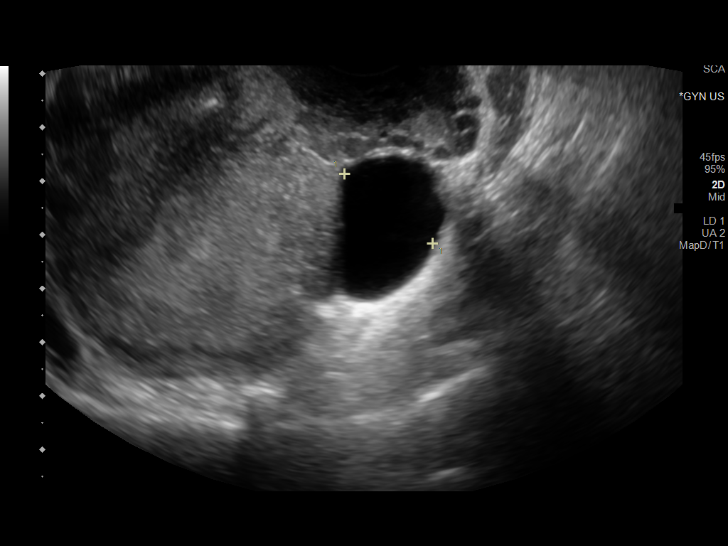
[im 73/73]
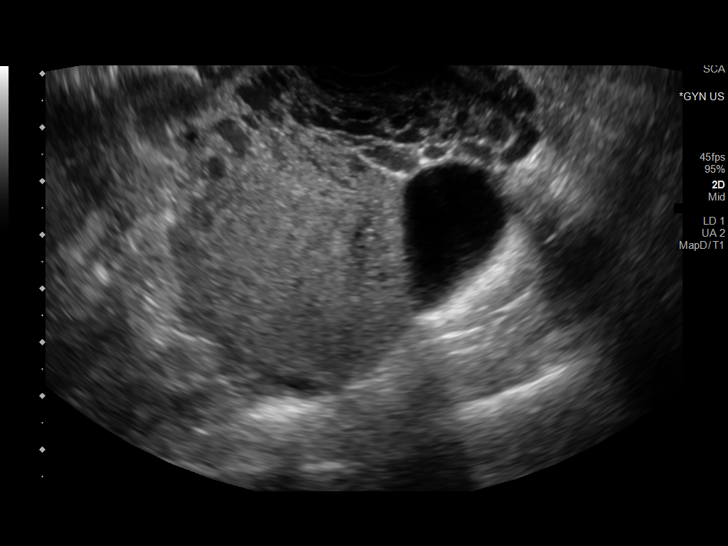

[Series 3: us pelvis complete with transvaginal · 1 of 1 slices shown (2 of 2)]
[im 1/1]
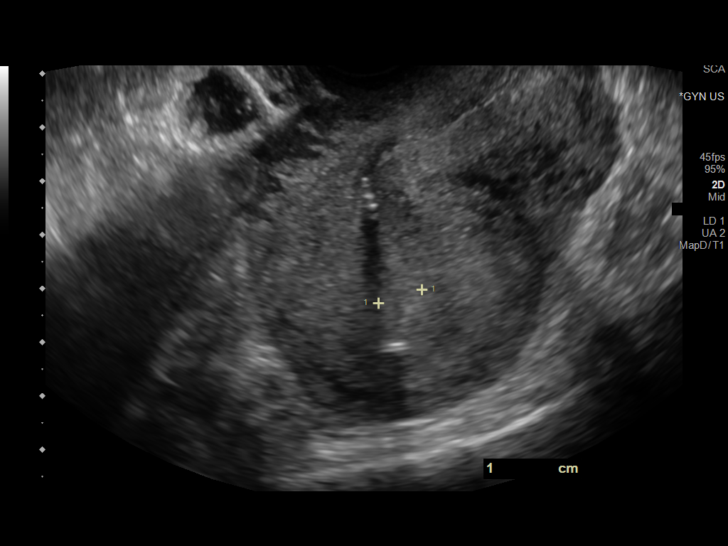

[13 of 25 positions shown; findings below may reference images not displayed]

FINDINGS: Uterus

Measurements: 9.5 x 4.9 x 5.3 centimeters = volume: 130.8 mL.
Retroflexed and normal in appearance.

Endometrium

Thickness: 8.5 millimeters.  No focal abnormality visualized.

Right ovary

Measurements: 2.7 x 2.4 x 2.5 centimeter = volume: 8.2 mL. Normal
appearance/no adnexal mass.

Left ovary

Measurements: 4.2 x 3.2 x 2.7 centimeter = volume: 18.8 mL. Normal
appearance/no adnexal mass.

Other findings

No abnormal free fluid.
IMPRESSION: Normal pelvic ultrasound.

Intrauterine device in expected location.

## 2021-03-21 ENCOUNTER — Other Ambulatory Visit (HOSPITAL_COMMUNITY): Payer: Self-pay | Admitting: Physician Assistant

## 2021-03-21 MED FILL — SERTRALINE HCL 50 MG TABLET: 50 | 90 days supply | Qty: 90 | Fill #0

## 2021-03-21 MED FILL — SERTRALINE HCL 100 MG TAB: 100 | 90 days supply | Qty: 90 | Fill #0

## 2021-03-21 MED FILL — clonazePAM 0.5 MG TABS: 0.5 | 10 days supply | Qty: 20 | Fill #1

## 2021-03-30 ENCOUNTER — Other Ambulatory Visit (HOSPITAL_BASED_OUTPATIENT_CLINIC_OR_DEPARTMENT_OTHER): Payer: Self-pay

## 2021-04-29 ENCOUNTER — Other Ambulatory Visit (HOSPITAL_COMMUNITY): Payer: Self-pay

## 2021-04-29 MED FILL — Clonazepam Tab 0.5 MG: ORAL | 10 days supply | Qty: 20 | Fill #0 | Status: AC

## 2021-07-27 ENCOUNTER — Other Ambulatory Visit (HOSPITAL_BASED_OUTPATIENT_CLINIC_OR_DEPARTMENT_OTHER): Payer: Self-pay

## 2021-11-02 ENCOUNTER — Other Ambulatory Visit (HOSPITAL_BASED_OUTPATIENT_CLINIC_OR_DEPARTMENT_OTHER): Payer: Self-pay

## 2021-12-09 ENCOUNTER — Other Ambulatory Visit (HOSPITAL_BASED_OUTPATIENT_CLINIC_OR_DEPARTMENT_OTHER): Payer: Self-pay

## 2024-01-18 ENCOUNTER — Other Ambulatory Visit (HOSPITAL_BASED_OUTPATIENT_CLINIC_OR_DEPARTMENT_OTHER): Payer: Self-pay
